# Patient Record
Sex: Female | Born: 1960 | Race: Black or African American | Hispanic: No | Marital: Single
Health system: Southern US, Community
[De-identification: ages and names within clinical notes are randomized; demographics above are authoritative.]

## PROBLEM LIST (undated history)

## (undated) DIAGNOSIS — E785 Hyperlipidemia, unspecified: Secondary | ICD-10-CM

## (undated) DIAGNOSIS — E669 Obesity, unspecified: Secondary | ICD-10-CM

## (undated) DIAGNOSIS — C50919 Malignant neoplasm of unspecified site of unspecified female breast: Secondary | ICD-10-CM

## (undated) DIAGNOSIS — T7840XA Allergy, unspecified, initial encounter: Secondary | ICD-10-CM

## (undated) DIAGNOSIS — L732 Hidradenitis suppurativa: Secondary | ICD-10-CM

## (undated) DIAGNOSIS — D649 Anemia, unspecified: Secondary | ICD-10-CM

## (undated) HISTORY — DX: Obesity, unspecified: E66.9

## (undated) HISTORY — DX: Allergy, unspecified, initial encounter: T78.40XA

---

## 2005-02-06 ENCOUNTER — Ambulatory Visit: Payer: Self-pay | Admitting: General Practice

## 2005-02-19 ENCOUNTER — Ambulatory Visit: Payer: Self-pay | Admitting: General Practice

## 2006-02-20 ENCOUNTER — Ambulatory Visit: Payer: Self-pay | Admitting: General Practice

## 2007-02-26 ENCOUNTER — Ambulatory Visit: Payer: Self-pay | Admitting: General Practice

## 2008-03-02 ENCOUNTER — Ambulatory Visit: Payer: Self-pay | Admitting: General Practice

## 2009-03-08 ENCOUNTER — Ambulatory Visit: Payer: Self-pay | Admitting: General Practice

## 2010-02-28 ENCOUNTER — Ambulatory Visit: Payer: Self-pay | Admitting: General Practice

## 2011-02-27 ENCOUNTER — Ambulatory Visit: Payer: Self-pay

## 2012-02-26 ENCOUNTER — Ambulatory Visit: Payer: Self-pay

## 2012-05-07 DIAGNOSIS — E669 Obesity, unspecified: Secondary | ICD-10-CM

## 2012-05-07 HISTORY — DX: Obesity, unspecified: E66.9

## 2013-03-04 ENCOUNTER — Ambulatory Visit: Payer: Self-pay | Admitting: General Practice

## 2013-06-14 IMAGING — MG MM DIGITAL SCREENING BILAT W/ CAD
1 series · 4 of 4 positions shown · non-contrast
Comparison: none

COMMENTS:

PROCEDURE:     MAM - MAM DGT SCR W/CAD CORP NO ORDER  - February 26, 2012 [DATE]
RESULT:     Breast are moderately dense and nodular. No mass. No pathologic
clustered calcification demonstrated. Tiny axillary lymph nodes are noted.

[R CC · right · 4 of 4 slices shown]
[im 1/4]
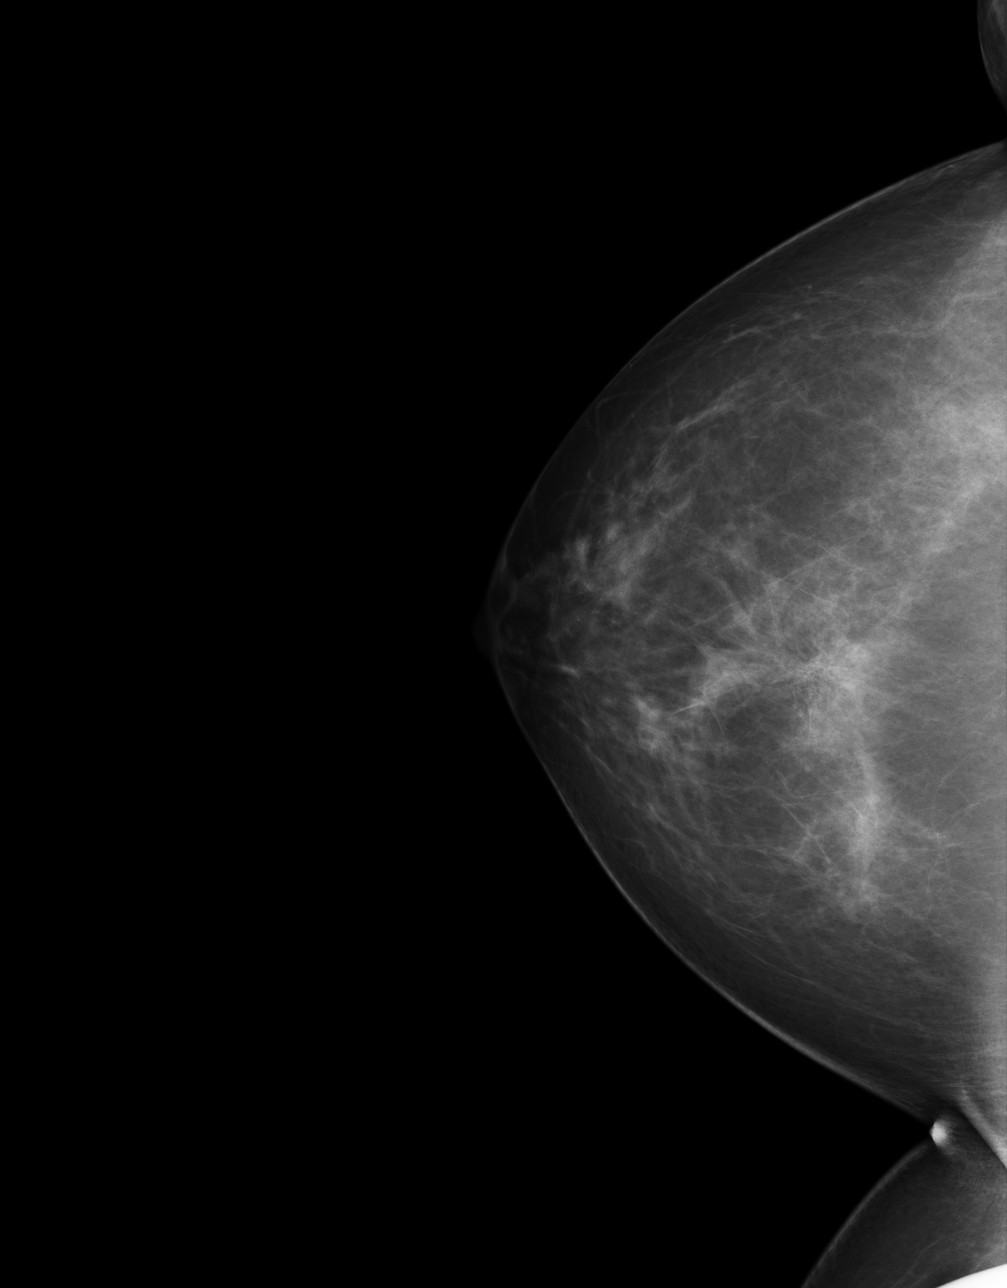
[im 2/4]
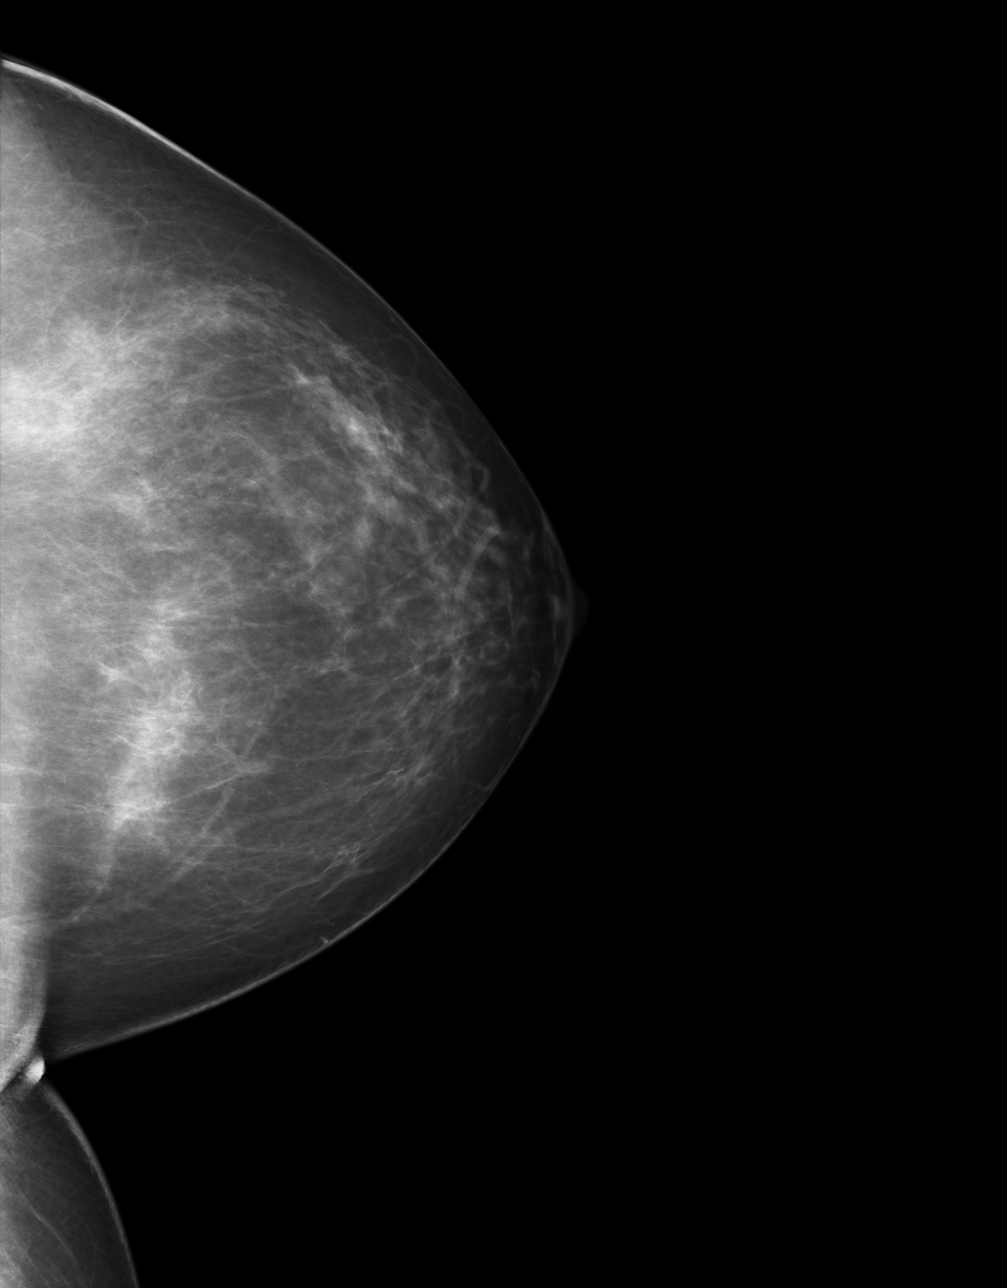
[im 3/4]
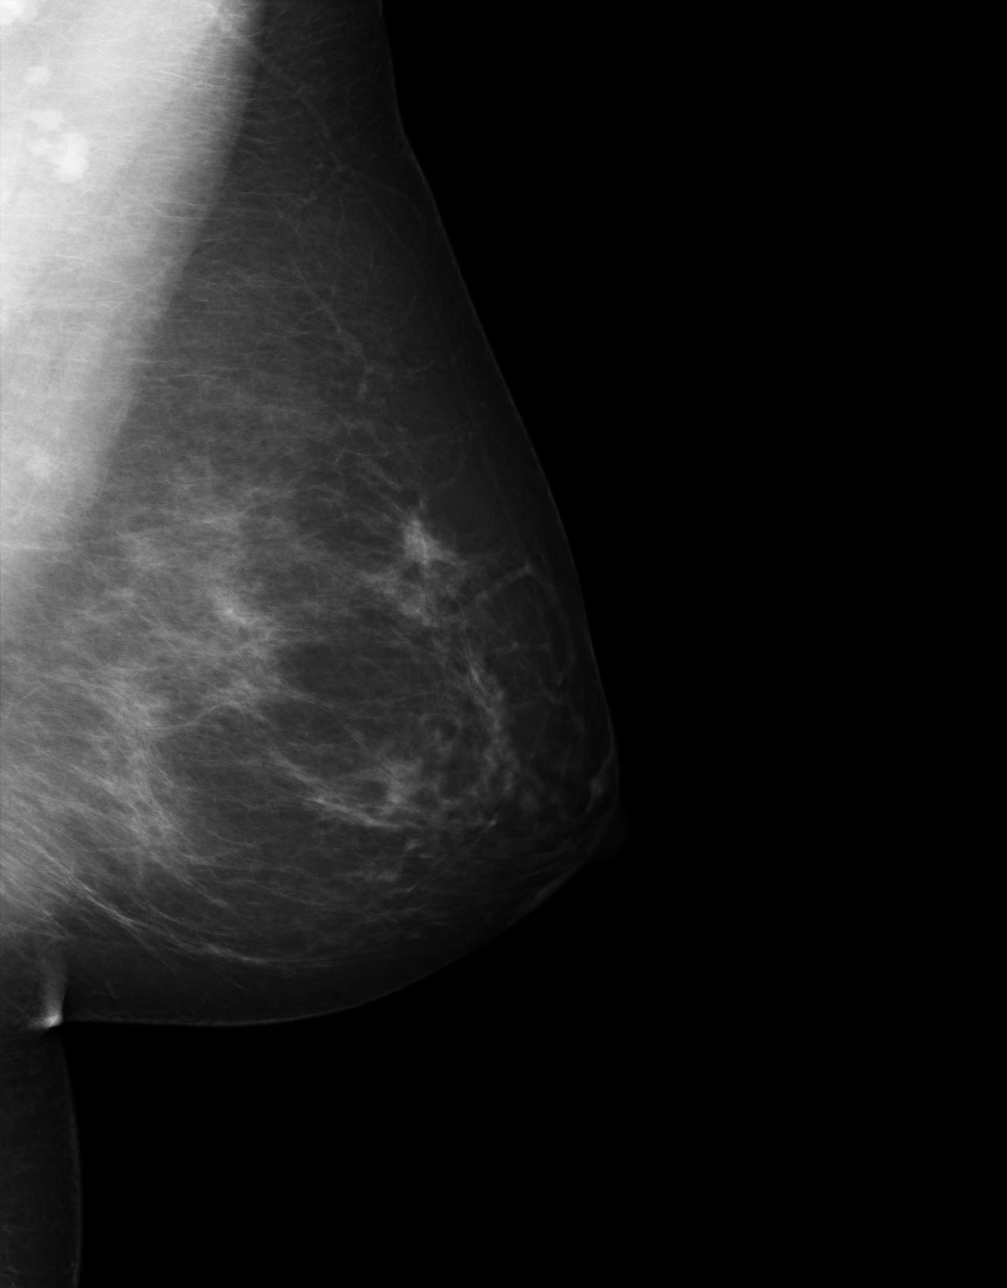
[im 4/4]
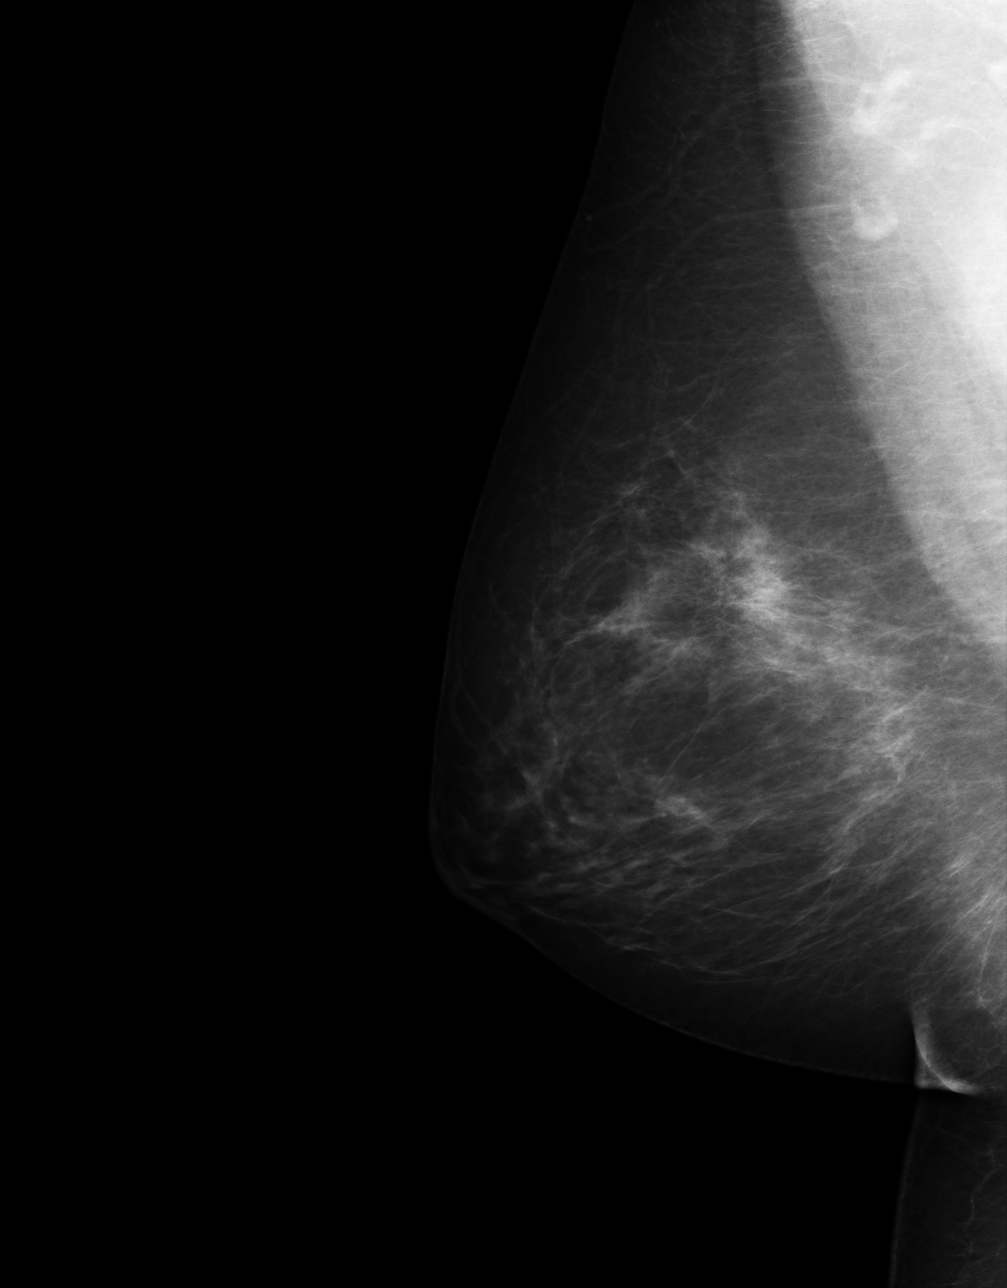

[4 of 4 positions shown; findings below may reference images not displayed]

IMPRESSION: Stable benign exam. No change from prior exams.

BI-RADS: Category 2- Benign Finding

A NEGATIVE MAMMOGRAM REPORT DOES NOT PRECLUDE BIOPSY OR OTHER EVALUATION OF
A CLINICALLY PALPABLE OR OTHERWISE SUSPICIOUS MASS OR LESION. BREAST CANCER
MAY NOT BE DETECTED IN UP TO 10% OF CASES.

## 2014-06-21 IMAGING — MG MM DIGITAL SCREENING BILAT W/ CAD
1 series · 5 of 5 positions shown · non-contrast
Comparison: Previous Exam(s)

CLINICAL DATA: Screening.

EXAM:
DIGITAL SCREENING BILATERAL MAMMOGRAM WITH CAD

[R CC · right · 5 of 5 slices shown]
[im 1/5]
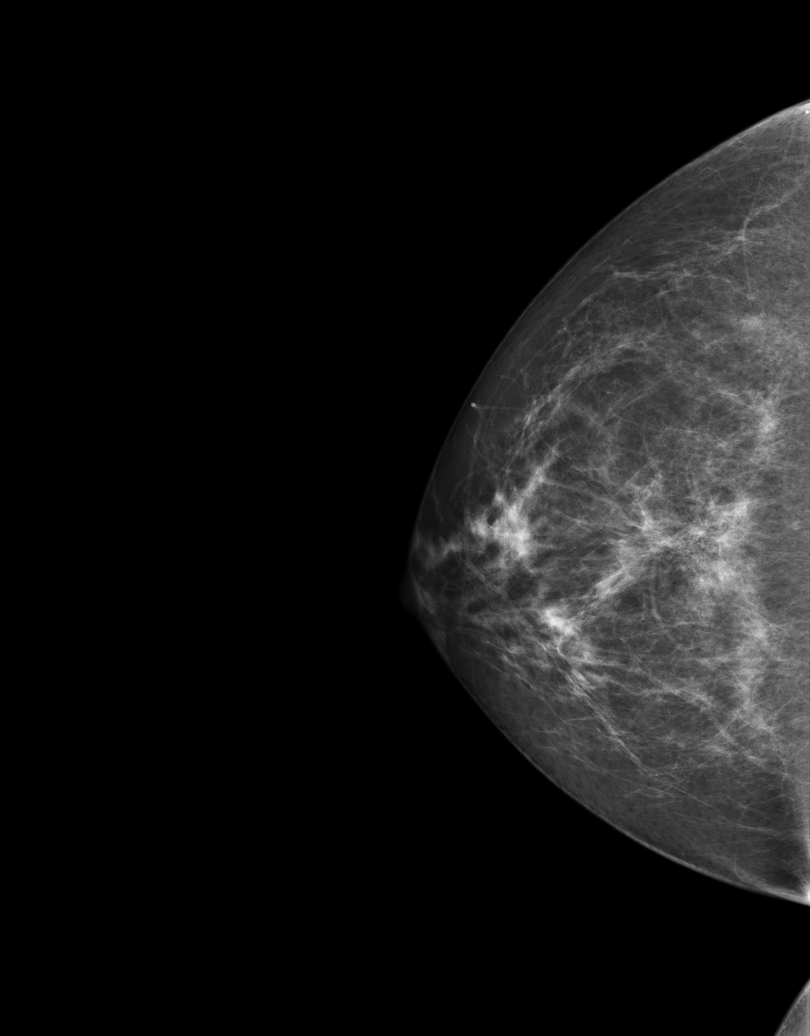
[im 2/5]
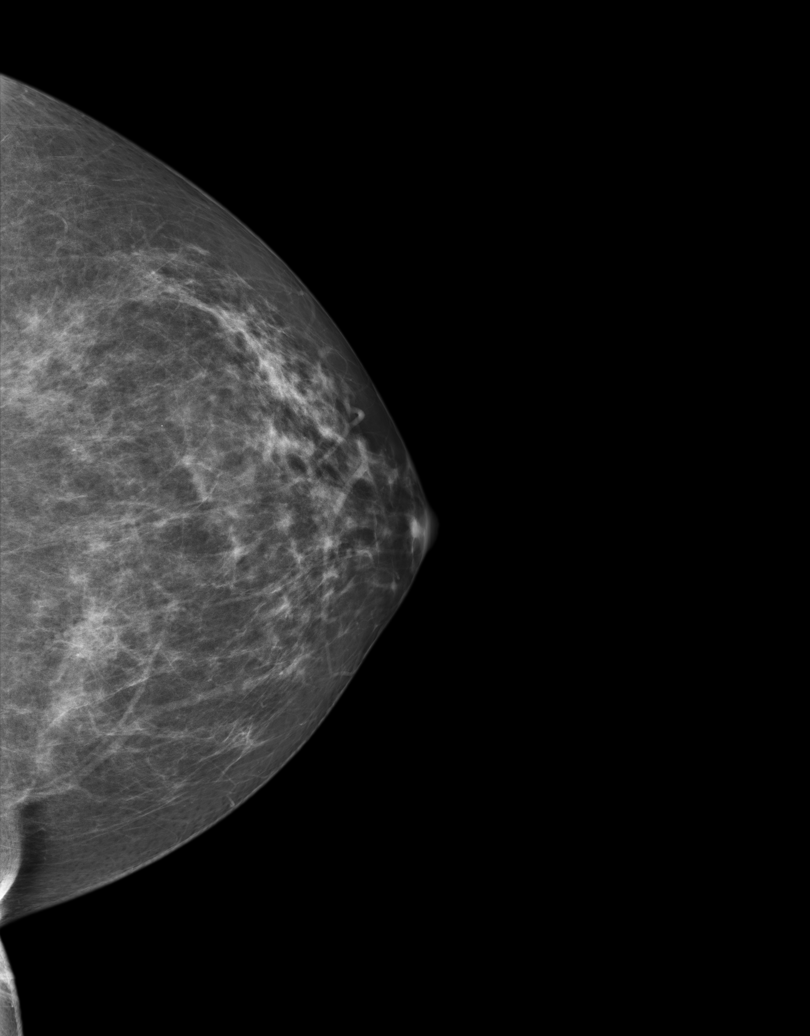
[im 3/5]
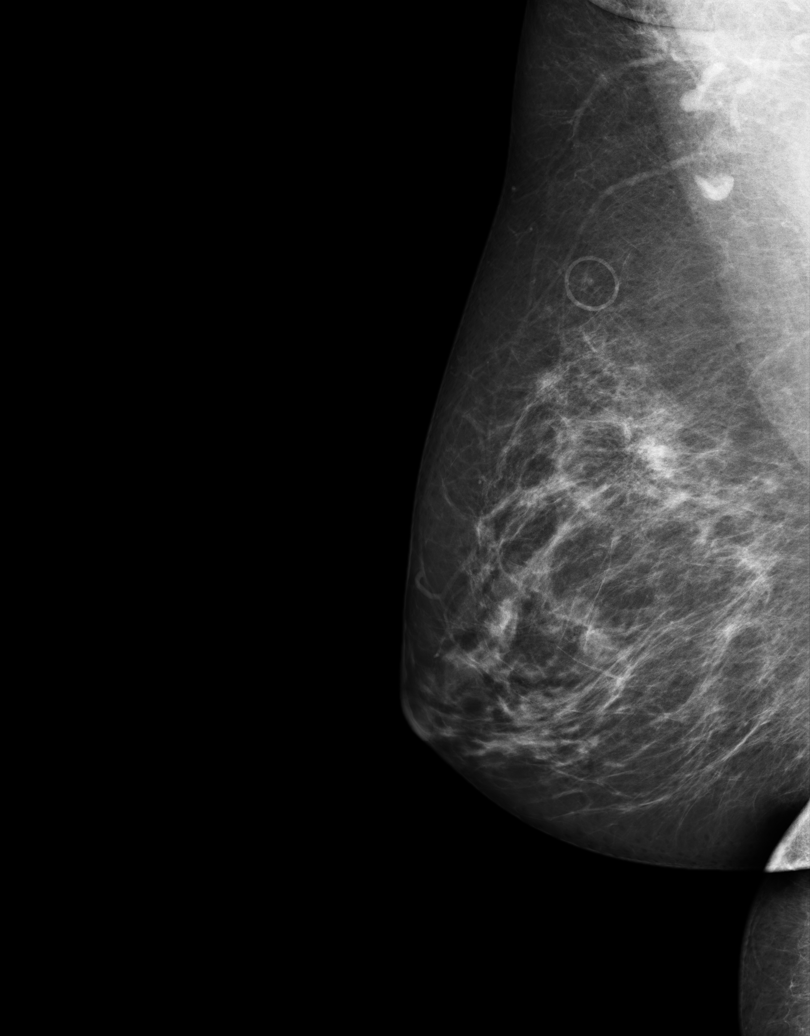
[im 4/5]
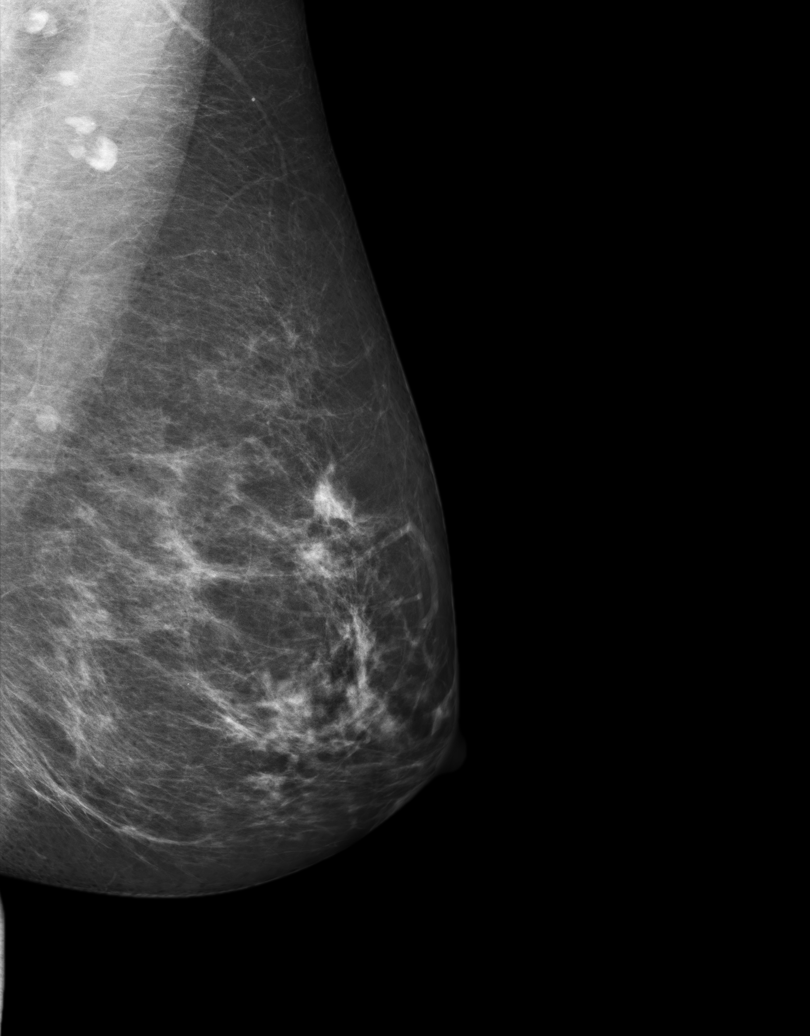
[im 5/5]
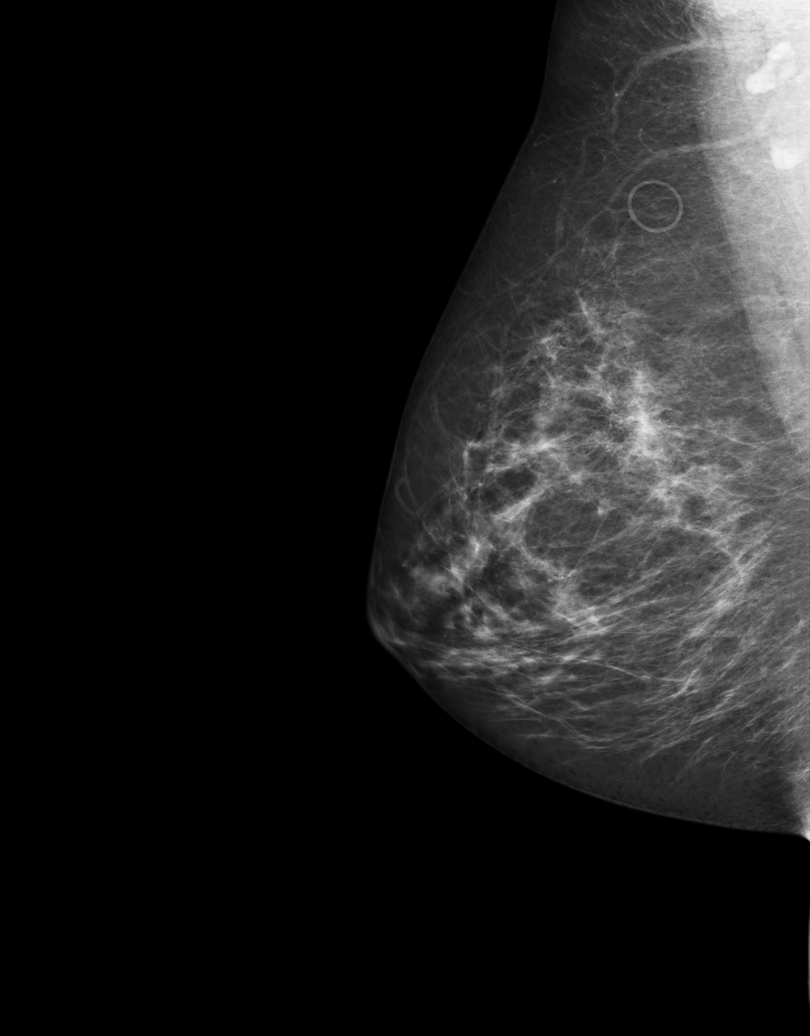

[5 of 5 positions shown; findings below may reference images not displayed]

ACR Breast Density Category c: The breasts are heterogeneously
dense, which may obscure small masses.
FINDINGS: In the right breast, possible distortion warrants further evaluation
with spot compression views and possibly ultrasound. In the left
breast, no suspicious masses or malignant type calcifications are
identified. Images were processed with CAD.
IMPRESSION: Further evaluation is suggested for possible distortion in the right
breast.

RECOMMENDATION:
Diagnostic mammogram and possibly ultrasound of the right breast.
(Code:C6-P-112)

The patient will be contacted regarding the findings, and additional
imaging will be scheduled.

BI-RADS CATEGORY  0: Incomplete. Need additional imaging evaluation
and/or prior mammograms for comparison.

## 2015-02-03 ENCOUNTER — Other Ambulatory Visit: Payer: Self-pay | Admitting: Family Medicine

## 2015-02-03 ENCOUNTER — Other Ambulatory Visit: Payer: Self-pay

## 2015-02-04 LAB — CMP12+LP+TP+TSH+6AC+CBC/D/PLT
A/G RATIO: 1.5 (ref 1.1–2.5)
ALK PHOS: 79 IU/L (ref 39–117)
ALT: 12 IU/L (ref 0–32)
AST: 16 IU/L (ref 0–40)
Albumin: 4.4 g/dL (ref 3.5–5.5)
BASOS: 0 %
BUN / CREAT RATIO: 17 (ref 9–23)
BUN: 13 mg/dL (ref 6–24)
Basophils Absolute: 0 10*3/uL (ref 0.0–0.2)
Bilirubin Total: 0.8 mg/dL (ref 0.0–1.2)
CALCIUM: 9.9 mg/dL (ref 8.7–10.2)
CHOL/HDL RATIO: 2.7 ratio (ref 0.0–4.4)
CREATININE: 0.76 mg/dL (ref 0.57–1.00)
Chloride: 101 mmol/L (ref 97–108)
Cholesterol, Total: 204 mg/dL — ABNORMAL HIGH (ref 100–199)
EOS (ABSOLUTE): 0.2 10*3/uL (ref 0.0–0.4)
EOS: 3 %
Estimated CHD Risk: <0.5 times avg. (ref 0.0–1.0)
Free Thyroxine Index: 2.2 (ref 1.2–4.9)
GFR, EST AFRICAN AMERICAN: 103 mL/min/{1.73_m2} (ref >59)
GFR, EST NON AFRICAN AMERICAN: 89 mL/min/{1.73_m2} (ref >59)
GGT: 11 IU/L (ref 0–60)
GLOBULIN, TOTAL: 2.9 g/dL (ref 1.5–4.5)
GLUCOSE: 93 mg/dL (ref 65–99)
HDL: 75 mg/dL (ref >39)
HEMATOCRIT: 40.9 % (ref 34.0–46.6)
Hemoglobin: 13.3 g/dL (ref 11.1–15.9)
IMMATURE GRANS (ABS): 0 10*3/uL (ref 0.0–0.1)
Immature Granulocytes: 0 %
Iron: 114 ug/dL (ref 27–159)
LDH: 206 IU/L (ref 119–226)
LDL CALC: 119 mg/dL — AB (ref 0–99)
LYMPHS: 51 %
Lymphocytes Absolute: 2.9 10*3/uL (ref 0.7–3.1)
MCH: 30.9 pg (ref 26.6–33.0)
MCHC: 32.5 g/dL (ref 31.5–35.7)
MCV: 95 fL (ref 79–97)
MONOCYTES: 6 %
Monocytes Absolute: 0.4 10*3/uL (ref 0.1–0.9)
NEUTROS ABS: 2.3 10*3/uL (ref 1.4–7.0)
Neutrophils: 40 %
PHOSPHORUS: 3.4 mg/dL (ref 2.5–4.5)
POTASSIUM: 4.6 mmol/L (ref 3.5–5.2)
Platelets: 218 10*3/uL (ref 150–379)
RBC: 4.31 x10E6/uL (ref 3.77–5.28)
RDW: 14.2 % (ref 12.3–15.4)
SODIUM: 141 mmol/L (ref 134–144)
T3 Uptake Ratio: 27 % (ref 24–39)
T4 TOTAL: 8.2 ug/dL (ref 4.5–12.0)
TRIGLYCERIDES: 51 mg/dL (ref 0–149)
TSH: 2.03 u[IU]/mL (ref 0.450–4.500)
Total Protein: 7.3 g/dL (ref 6.0–8.5)
URIC ACID: 6.5 mg/dL (ref 2.5–7.1)
VLDL Cholesterol Cal: 10 mg/dL (ref 5–40)
WBC: 5.8 10*3/uL (ref 3.4–10.8)

## 2015-02-04 LAB — HGB A1C W/O EAG: HEMOGLOBIN A1C: 5.7 % — AB (ref 4.8–5.6)

## 2015-09-26 ENCOUNTER — Other Ambulatory Visit: Payer: Self-pay | Admitting: Family Medicine

## 2015-09-27 LAB — CMP12+LP+TP+TSH+6AC+CBC/D/PLT
ALBUMIN: 4.2 g/dL (ref 3.5–5.5)
ALK PHOS: 101 IU/L (ref 39–117)
ALT: 11 IU/L (ref 0–32)
AST: 12 IU/L (ref 0–40)
Albumin/Globulin Ratio: 1.5 (ref 1.2–2.2)
BASOS: 0 %
BILIRUBIN TOTAL: 0.7 mg/dL (ref 0.0–1.2)
BUN / CREAT RATIO: 18 (ref 9–23)
BUN: 13 mg/dL (ref 6–24)
Basophils Absolute: 0 10*3/uL (ref 0.0–0.2)
CALCIUM: 9.7 mg/dL (ref 8.7–10.2)
CHLORIDE: 100 mmol/L (ref 96–106)
CREATININE: 0.74 mg/dL (ref 0.57–1.00)
Chol/HDL Ratio: 2.6 ratio units (ref 0.0–4.4)
Cholesterol, Total: 206 mg/dL — ABNORMAL HIGH (ref 100–199)
EOS (ABSOLUTE): 0.3 10*3/uL (ref 0.0–0.4)
EOS: 4 %
Free Thyroxine Index: 1.6 (ref 1.2–4.9)
GFR calc Af Amer: 106 mL/min/{1.73_m2} (ref >59)
GFR, EST NON AFRICAN AMERICAN: 92 mL/min/{1.73_m2} (ref >59)
GGT: 12 IU/L (ref 0–60)
GLUCOSE: 91 mg/dL (ref 65–99)
Globulin, Total: 2.8 g/dL (ref 1.5–4.5)
HDL: 80 mg/dL (ref >39)
HEMATOCRIT: 40.5 % (ref 34.0–46.6)
HEMOGLOBIN: 13.2 g/dL (ref 11.1–15.9)
IMMATURE GRANULOCYTES: 0 %
Immature Grans (Abs): 0 10*3/uL (ref 0.0–0.1)
Iron: 58 ug/dL (ref 27–159)
LDH: 192 IU/L (ref 119–226)
LDL CALC: 115 mg/dL — AB (ref 0–99)
LYMPHS ABS: 3.2 10*3/uL — AB (ref 0.7–3.1)
Lymphs: 42 %
MCH: 30.6 pg (ref 26.6–33.0)
MCHC: 32.6 g/dL (ref 31.5–35.7)
MCV: 94 fL (ref 79–97)
MONOS ABS: 0.6 10*3/uL (ref 0.1–0.9)
Monocytes: 8 %
NEUTROS ABS: 3.4 10*3/uL (ref 1.4–7.0)
NEUTROS PCT: 46 %
POTASSIUM: 4.7 mmol/L (ref 3.5–5.2)
Phosphorus: 3.1 mg/dL (ref 2.5–4.5)
Platelets: 239 10*3/uL (ref 150–379)
RBC: 4.31 x10E6/uL (ref 3.77–5.28)
RDW: 14.5 % (ref 12.3–15.4)
SODIUM: 141 mmol/L (ref 134–144)
T3 Uptake Ratio: 26 % (ref 24–39)
T4, Total: 6.3 ug/dL (ref 4.5–12.0)
TOTAL PROTEIN: 7 g/dL (ref 6.0–8.5)
TSH: 3.98 u[IU]/mL (ref 0.450–4.500)
Triglycerides: 53 mg/dL (ref 0–149)
URIC ACID: 6.3 mg/dL (ref 2.5–7.1)
VLDL CHOLESTEROL CAL: 11 mg/dL (ref 5–40)
WBC: 7.5 10*3/uL (ref 3.4–10.8)

## 2015-09-27 LAB — VITAMIN D 25 HYDROXY (VIT D DEFICIENCY, FRACTURES): VIT D 25 HYDROXY: 30.8 ng/mL (ref 30.0–100.0)

## 2015-09-27 LAB — HGB A1C W/O EAG: Hgb A1c MFr Bld: 5.5 % (ref 4.8–5.6)

## 2016-10-02 ENCOUNTER — Ambulatory Visit: Payer: Self-pay | Admitting: *Deleted

## 2016-10-02 VITALS — BP 150/90 | Ht 69.0 in | Wt 272.0 lb

## 2016-10-02 DIAGNOSIS — Z0000 Encounter for general adult medical examination without abnormal findings: Secondary | ICD-10-CM

## 2016-10-02 NOTE — Progress Notes (Signed)
[employer] insurance premium discount evaluation: Labs Drawn. Replacements ROI form signed. Tobacco Free Attestation form signed.  Forms placed in paper chart.  

## 2016-10-03 LAB — CMP12+LP+TP+TSH+6AC+CBC/D/PLT
ALBUMIN: 4.5 g/dL (ref 3.5–5.5)
ALT: 13 IU/L (ref 0–32)
AST: 18 IU/L (ref 0–40)
Albumin/Globulin Ratio: 1.6 (ref 1.2–2.2)
Alkaline Phosphatase: 94 IU/L (ref 39–117)
BUN/Creatinine Ratio: 16 (ref 9–23)
BUN: 13 mg/dL (ref 6–24)
Basophils Absolute: 0 10*3/uL (ref 0.0–0.2)
Basos: 0 %
Bilirubin Total: 0.6 mg/dL (ref 0.0–1.2)
CALCIUM: 9.6 mg/dL (ref 8.7–10.2)
CHOLESTEROL TOTAL: 204 mg/dL — AB (ref 100–199)
Chloride: 101 mmol/L (ref 96–106)
Chol/HDL Ratio: 2.8 ratio (ref 0.0–4.4)
Creatinine, Ser: 0.81 mg/dL (ref 0.57–1.00)
EOS (ABSOLUTE): 0.1 10*3/uL (ref 0.0–0.4)
Eos: 2 %
Estimated CHD Risk: <0.5 times avg. (ref 0.0–1.0)
FREE THYROXINE INDEX: 1.5 (ref 1.2–4.9)
GFR calc Af Amer: 95 mL/min/{1.73_m2} (ref >59)
GFR calc non Af Amer: 82 mL/min/{1.73_m2} (ref >59)
GGT: 13 IU/L (ref 0–60)
Globulin, Total: 2.9 g/dL (ref 1.5–4.5)
Glucose: 91 mg/dL (ref 65–99)
HDL: 73 mg/dL (ref >39)
Hematocrit: 40.9 % (ref 34.0–46.6)
Hemoglobin: 13.4 g/dL (ref 11.1–15.9)
IMMATURE GRANS (ABS): 0 10*3/uL (ref 0.0–0.1)
IMMATURE GRANULOCYTES: 0 %
Iron: 83 ug/dL (ref 27–159)
LDH: 200 IU/L (ref 119–226)
LDL Calculated: 119 mg/dL — ABNORMAL HIGH (ref 0–99)
LYMPHS: 51 %
Lymphocytes Absolute: 3 10*3/uL (ref 0.7–3.1)
MCH: 30.6 pg (ref 26.6–33.0)
MCHC: 32.8 g/dL (ref 31.5–35.7)
MCV: 93 fL (ref 79–97)
Monocytes Absolute: 0.4 10*3/uL (ref 0.1–0.9)
Monocytes: 7 %
NEUTROS PCT: 40 %
Neutrophils Absolute: 2.4 10*3/uL (ref 1.4–7.0)
PHOSPHORUS: 3.2 mg/dL (ref 2.5–4.5)
Platelets: 243 10*3/uL (ref 150–379)
Potassium: 4 mmol/L (ref 3.5–5.2)
RBC: 4.38 x10E6/uL (ref 3.77–5.28)
RDW: 14 % (ref 12.3–15.4)
Sodium: 141 mmol/L (ref 134–144)
T3 Uptake Ratio: 23 % — ABNORMAL LOW (ref 24–39)
T4 TOTAL: 6.4 ug/dL (ref 4.5–12.0)
TSH: 2.77 u[IU]/mL (ref 0.450–4.500)
Total Protein: 7.4 g/dL (ref 6.0–8.5)
Triglycerides: 59 mg/dL (ref 0–149)
Uric Acid: 6.3 mg/dL (ref 2.5–7.1)
VLDL Cholesterol Cal: 12 mg/dL (ref 5–40)
WBC: 5.9 10*3/uL (ref 3.4–10.8)

## 2016-10-03 LAB — VITAMIN D 25 HYDROXY (VIT D DEFICIENCY, FRACTURES): Vit D, 25-Hydroxy: 29.6 ng/mL — ABNORMAL LOW (ref 30.0–100.0)

## 2016-10-03 LAB — HGB A1C W/O EAG: Hgb A1c MFr Bld: 5.3 % (ref 4.8–5.6)

## 2016-10-04 NOTE — Progress Notes (Signed)
Reviewed labs with pt. Discussed sunlight exposure recommendations for vit D level improvement. Discussed recommended diet and exercise. Pt improving diet over past 3 months. Expects to see continued improvement in labs. Planning to discuss HTN and lipid panel with her PCP as she has a strong family Hx of both. Results routed to pcp.

## 2016-10-15 ENCOUNTER — Telehealth: Payer: Self-pay | Admitting: *Deleted

## 2016-10-15 DIAGNOSIS — L2089 Other atopic dermatitis: Secondary | ICD-10-CM

## 2016-10-15 NOTE — Telephone Encounter (Signed)
Pt reports previous providers in clinic would refill her eczema cream. Requesting refill now. Previous fills documented in paper chart in 2014 and 2016.  Fluocinonide 0.05% BID 60g Pharmacy [facility] [place].

## 2016-10-16 ENCOUNTER — Encounter: Payer: Self-pay | Admitting: *Deleted

## 2016-10-16 DIAGNOSIS — L209 Atopic dermatitis, unspecified: Secondary | ICD-10-CM | POA: Insufficient documentation

## 2016-10-16 MED ORDER — FLUOCINONIDE-E 0.05 % EX CREA: 1.0000 "application " | TOPICAL_CREAM | Freq: Two times a day (BID) | CUTANEOUS | 0 refills | Status: DC

## 2016-10-16 NOTE — Telephone Encounter (Signed)
Discussed with patient 1 tube fluocinonide 0.05% 60grams lasts approximately 1 year; applies BID prn arms and stomach.  History of nickel allergy and eczema prn use.  Routine follow up with PCM; refill submitted for patient if worsening seek re-evaluation.  Patient reported spring flare common for her/worsening of symptoms.  Patient verbalized understanding information/instructions, agreed with plan of care and had no further questions at this time.

## 2016-10-16 NOTE — Telephone Encounter (Signed)
Patient requested refill lidex topical.

## 2018-04-17 ENCOUNTER — Encounter: Payer: Self-pay | Admitting: Registered Nurse

## 2018-04-17 ENCOUNTER — Ambulatory Visit: Payer: Self-pay | Admitting: Registered Nurse

## 2018-04-17 VITALS — BP 170/92 | HR 108 | Temp 98.3°F

## 2018-04-17 DIAGNOSIS — J0100 Acute maxillary sinusitis, unspecified: Secondary | ICD-10-CM

## 2018-04-17 DIAGNOSIS — A084 Viral intestinal infection, unspecified: Secondary | ICD-10-CM

## 2018-04-17 DIAGNOSIS — R030 Elevated blood-pressure reading, without diagnosis of hypertension: Secondary | ICD-10-CM

## 2018-04-17 DIAGNOSIS — H6983 Other specified disorders of Eustachian tube, bilateral: Secondary | ICD-10-CM

## 2018-04-17 MED ORDER — ACETAMINOPHEN 500 MG PO TABS: 1000.0000 mg | ORAL_TABLET | Freq: Four times a day (QID) | ORAL | 0 refills | Status: AC | PRN

## 2018-04-17 MED ORDER — AZITHROMYCIN 250 MG PO TABS: ORAL_TABLET | ORAL | 0 refills | Status: DC

## 2018-04-17 MED ORDER — FLUTICASONE PROPIONATE 50 MCG/ACT NA SUSP: 1.0000 | Freq: Two times a day (BID) | NASAL | 1 refills | Status: DC

## 2018-04-17 NOTE — Progress Notes (Signed)
Subjective:    Patient ID: Kathleen Silva, female    DOB: 1961-01-24, 57 y.o.   MRN: [id number]  57y/o African-American female pt c/o frontal and maxillary sinus pain and pressure x4 days. Denies pressure behind the eyes or dental pain. Using OTC sinus med at home. L ear itching and eustachian tube discomfort. Denies ear pain, drainage, hearing changes. Denies sore throat, cough, chest congestion. +nasal congestion, frequent throat clearing.        Review of Systems  Constitutional: Positive for appetite change. Negative for activity change, chills, diaphoresis, fatigue, fever and unexpected weight change.  HENT: Positive for congestion, ear pain, sinus pressure and sinus pain. Negative for dental problem, drooling, ear discharge, facial swelling, hearing loss, mouth sores, nosebleeds, postnasal drip, rhinorrhea, sneezing, sore throat, tinnitus, trouble swallowing and voice change.   Eyes: Negative for photophobia, pain, discharge, redness, itching and visual disturbance.  Respiratory: Negative for cough, choking, chest tightness, shortness of breath, wheezing and stridor.   Cardiovascular: Negative for chest pain, palpitations and leg swelling.  Gastrointestinal: Positive for diarrhea. Negative for abdominal distention, abdominal pain, constipation, nausea and vomiting.  Endocrine: Negative for cold intolerance and heat intolerance.  Genitourinary: Negative for difficulty urinating.  Musculoskeletal: Negative for arthralgias, back pain, gait problem, joint swelling, myalgias, neck pain and neck stiffness.  Skin: Negative for color change, pallor, rash and wound.  Allergic/Immunologic: Negative for environmental allergies and food allergies.  Neurological: Positive for headaches. Negative for dizziness, tremors, seizures, syncope, facial asymmetry, speech difficulty, weakness, light-headedness and numbness.  Hematological: Negative for adenopathy. Does not bruise/bleed easily.   Psychiatric/Behavioral: Negative for agitation, behavioral problems, confusion and sleep disturbance.       Objective:   Physical Exam Vitals signs and nursing note reviewed.  Constitutional:      General: She is not in acute distress.    Appearance: Normal appearance. She is well-developed. She is obese. She is not ill-appearing, toxic-appearing or diaphoretic.  HENT:     Head: Normocephalic and atraumatic.     Jaw: There is normal jaw occlusion. No trismus.     Right Ear: Hearing, ear canal and external ear normal. A middle ear effusion is present. There is no impacted cerumen.     Left Ear: Hearing, ear canal and external ear normal. A middle ear effusion is present. There is no impacted cerumen.     Nose: Mucosal edema, congestion and rhinorrhea present. No nasal deformity, septal deviation or laceration.     Right Sinus: Maxillary sinus tenderness present. No frontal sinus tenderness.     Left Sinus: Maxillary sinus tenderness present. No frontal sinus tenderness.     Mouth/Throat:     Lips: Pink.     Mouth: Mucous membranes are moist. Mucous membranes are not pale, not dry and not cyanotic. No lacerations or oral lesions.     Dentition: Normal dentition. Does not have dentures. No dental caries, dental abscesses or gum lesions.     Pharynx: Uvula midline. Posterior oropharyngeal erythema present. No oropharyngeal exudate or uvula swelling.     Tonsils: No tonsillar exudate or tonsillar abscesses. Swelling: 0 on the right. 0 on the left.     Comments: Cobblestoning posterior pharynx; bilateral TMs air fluid level clear; bilateral allergic shiners; mild maxillary tenderness with palpation and pain behind bridge of nose and eyes with flexion at waist; clear discharge bilateral nasal turbinates edema/erythema Eyes:     General: Lids are normal. No scleral icterus.       Right  eye: No foreign body, discharge or hordeolum.        Left eye: No foreign body, discharge or hordeolum.      Extraocular Movements:     Right eye: Normal extraocular motion and no nystagmus.     Left eye: Normal extraocular motion and no nystagmus.     Conjunctiva/sclera: Conjunctivae normal.     Right eye: Right conjunctiva is not injected. No chemosis, exudate or hemorrhage.    Left eye: Left conjunctiva is not injected. No chemosis, exudate or hemorrhage.    Pupils: Pupils are equal, round, and reactive to light. Pupils are equal.     Right eye: Pupil is round and reactive.     Left eye: Pupil is round and reactive.  Neck:     Musculoskeletal: Normal range of motion and neck supple. Normal range of motion. No edema, erythema, neck rigidity, spinous process tenderness or muscular tenderness.     Thyroid: No thyroid mass or thyromegaly.     Trachea: Trachea and phonation normal. No tracheal tenderness or tracheal deviation.  Cardiovascular:     Rate and Rhythm: Regular rhythm. Tachycardia present.     Chest Wall: PMI is not displaced.     Pulses:          Radial pulses are 2+ on the right side and 2+ on the left side.     Heart sounds: Normal heart sounds, S1 normal and S2 normal. No murmur. No friction rub. No gallop.   Pulmonary:     Effort: Pulmonary effort is normal. No accessory muscle usage or respiratory distress.     Breath sounds: Normal breath sounds and air entry. No stridor or decreased air movement. No decreased breath sounds, wheezing, rhonchi or rales.     Comments: No cough observed in exam room ;spoke full sentences without difficulty Chest:     Chest wall: No tenderness.  Abdominal:     General: There is no distension.     Palpations: Abdomen is soft.     Tenderness: There is no guarding.  Musculoskeletal: Normal range of motion.        General: No tenderness.     Right shoulder: Normal.     Left shoulder: Normal.     Right elbow: Normal.    Left elbow: Normal.     Right hip: Normal.     Left hip: Normal.     Right knee: Normal.     Left knee: Normal.     Cervical  back: Normal.     Thoracic back: Normal.     Lumbar back: Normal.     Right hand: Normal.     Left hand: Normal.  Lymphadenopathy:     Head:     Right side of head: No submental, submandibular, tonsillar, preauricular, posterior auricular or occipital adenopathy.     Left side of head: No submental, submandibular, tonsillar, preauricular, posterior auricular or occipital adenopathy.     Cervical: No cervical adenopathy.     Right cervical: No superficial, deep or posterior cervical adenopathy.    Left cervical: No superficial, deep or posterior cervical adenopathy.  Skin:    General: Skin is warm and dry.     Capillary Refill: Capillary refill takes less than 2 seconds.     Coloration: Skin is not pale.     Findings: No abrasion, bruising, burn, ecchymosis, erythema, laceration, lesion, petechiae or rash.     Nails: There is no clubbing.   Neurological:  General: No focal deficit present.     Mental Status: She is alert and oriented to person, place, and time. She is not disoriented.     GCS: GCS eye subscore is 4. GCS verbal subscore is 5. GCS motor subscore is 6.     Cranial Nerves: No cranial nerve deficit.     Sensory: Sensation is intact. No sensory deficit.     Motor: Motor function is intact. No weakness, tremor, atrophy, abnormal muscle tone or seizure activity.     Coordination: Coordination is intact. Coordination normal.     Gait: Gait normal.     Comments: On/off exam table and in/out of chair without difficulty; gait sure and steady in hallway  Psychiatric:        Attention and Perception: Attention and perception normal.        Mood and Affect: Mood and affect normal.        Speech: Speech normal.        Behavior: Behavior normal. Behavior is cooperative.        Thought Content: Thought content normal.        Cognition and Memory: Cognition and memory normal.        Judgment: Judgment normal.           Assessment & Plan:  A-acute maxillary sinusitis and  viral gastroenteritis,elevated blood pressure, morbid obesity, eustachian tube dysfunction  P-on OTC medications and recent diarrhea push po fluids.  Discussed side effects of otc cough and cold medications increased heartrate/blood pressure possible.  Acute illness/pain can also increase pulse and BP.  Patient to follow up with RN Rolly SalterHaley when acute illness resolved and off OTC meds.  Discussed ER if chest pain, worst headache of life, dyspnea or visual changes for re-evaluation.  Will see RN Rolly SalterHaley for repeat BP check once acute illness resolved or prn worsening of symptoms sooner.  Exitcare handout hypertension.  Patient verbalized understanding information/instructions, agreed with plan of care and had no further questions at this time.  Restart flonase 1 spray each nostril BID, saline 2 sprays each nostril q2h wa prn congestion #1 RF1 electronic Rx to her pharmacy of choice.  Start nasal saline 2 sprays each nostril q2h wa given 1 bottle from clinic stock.  May continue otc cough and cold formula per manufacturer instructions.  Tylenol 1000mg  po QID prn pain discussed motrin could further raise blood pressure avoid at this time.  If no improvement with 48 hours of saline and flonase use start azithromycin 250mg  sig take 2 tabs day1 then 1 tab daily days 2-5 #6 RF0 dispensed from [facility] to patient  Denied personal or family history of ENT cancer.  Shower BID especially prior to bed. No evidence of systemic bacterial infection, non toxic and well hydrated.  I do not see where any further testing or imaging is necessary at this time.   I will suggest supportive care, rest, good hygiene and encourage the patient to take adequate fluids.  The patient is to return to clinic or EMERGENCY ROOM if symptoms worsen or change significantly.  Exitcare handout on sinusitis and sinus rinse.  Patient verbalized agreement and understanding of treatment plan and had no further questions at this time.   P2:  Hand washing and  cover cough  Hydrate to keep urine pale clear yellow tinged.  If diarrhea returns decrease juice/added sugars, fried/spicy foods and eat smaller portions dairy/meat if stomach upset.  Recommend soup broth, gatorade/powerade, water, ginger ale.  I have recommended  clear fluids and bland diet. Avoid dairy/spicy, fried and large portions of meat while having diarrhea Return to the clinic if symptoms persist or worsen; I have alerted the patient to call if high fever, dehydration, marked weakness, fainting, increased abdominal pain, blood in stool or vomit (red or black). Exitcare handout on gastroenteritis and diarrhea Patient verbalized agreement and understanding of treatment plan and had no further questions at this time.   Supportive treatment.   No evidence of invasive bacterial infection, non toxic and well hydrated.  This is most likely self limiting viral infection.  I do not see where any further testing or imaging is necessary at this time.   I will suggest supportive care, rest, good hygiene and encourage the patient to take adequate fluids.  The patient is to return to clinic or EMERGENCY ROOM if symptoms worsen or change significantly e.g. ear pain, fever, purulent discharge from ears or bleeding.  Exitcare handout on eustachian tube dysfunction.  Patient verbalized agreement and understanding of treatment plan.

## 2018-04-17 NOTE — Patient Instructions (Signed)
Eustachian Tube Dysfunction The eustachian tube connects the middle ear to the back of the nose. It regulates air pressure in the middle ear by allowing air to move between the ear and nose. It also helps to drain fluid from the middle ear space. When the eustachian tube does not function properly, air pressure, fluid, or both can build up in the middle ear. Eustachian tube dysfunction can affect one or both ears. What are the causes? This condition happens when the eustachian tube becomes blocked or cannot open normally. This may result from:  Ear infections.  Colds and other upper respiratory infections.  Allergies.  Irritation, such as from cigarette smoke or acid from the stomach coming up into the esophagus (gastroesophageal reflux).  Sudden changes in air pressure, such as from descending in an airplane.  Abnormal growths in the nose or throat, such as nasal polyps, tumors, or enlarged tissue at the back of the throat (adenoids).  What increases the risk? This condition may be more likely to develop in people who smoke and people who are overweight. Eustachian tube dysfunction may also be more likely to develop in children, especially children who have:  Certain birth defects of the mouth, such as cleft palate.  Large tonsils and adenoids.  What are the signs or symptoms? Symptoms of this condition may include:  A feeling of fullness in the ear.  Ear pain.  Clicking or popping noises in the ear.  Ringing in the ear.  Hearing loss.  Loss of balance.  Symptoms may get worse when the air pressure around you changes, such as when you travel to an area of high elevation or fly on an airplane. How is this diagnosed? This condition may be diagnosed based on:  Your symptoms.  A physical exam of your ear, nose, and throat.  Tests, such as those that measure: ? The movement of your eardrum (tympanogram). ? Your hearing (audiometry).  How is this treated? Treatment  depends on the cause and severity of your condition. If your symptoms are mild, you may be able to relieve your symptoms by moving air into ("popping") your ears. If you have symptoms of fluid in your ears, treatment may include:  Decongestants.  Antihistamines.  Nasal sprays or ear drops that contain medicines that reduce swelling (steroids).  In some cases, you may need to have a procedure to drain the fluid in your eardrum (myringotomy). In this procedure, a small tube is placed in the eardrum to:  Drain the fluid.  Restore the air in the middle ear space.  Follow these instructions at home:  Take over-the-counter and prescription medicines only as told by your health care provider.  Use techniques to help pop your ears as recommended by your health care provider. These may include: ? Chewing gum. ? Yawning. ? Frequent, forceful swallowing. ? Closing your mouth, holding your nose closed, and gently blowing as if you are trying to blow air out of your nose.  Do not do any of the following until your health care provider approves: ? Travel to high altitudes. ? Fly in airplanes. ? Work in a pressurized cabin or room. ? Scuba dive.  Keep your ears dry. Dry your ears completely after showering or bathing.  Do not smoke.  Keep all follow-up visits as told by your health care provider. This is important. Contact a health care provider if:  Your symptoms do not go away after treatment.  Your symptoms come back after treatment.  You are   unable to pop your ears.  You have: ? A fever. ? Pain in your ear. ? Pain in your head or neck. ? Fluid draining from your ear.  Your hearing suddenly changes.  You become very dizzy.  You lose your balance. This information is not intended to replace advice given to you by your health care provider. Make sure you discuss any questions you have with your health care provider. Document Released: 05/20/2015 Document Revised: 09/29/2015  Document Reviewed: 05/12/2014 Elsevier Interactive Patient Education  2018 [employer]. Sinus Rinse What is a sinus rinse? A sinus rinse is a simple home treatment that is used to rinse your sinuses with a sterile mixture of salt and water (saline solution). Sinuses are air-filled spaces in your skull behind the bones of your face and forehead that open into your nasal cavity. You will use the following:  Saline solution.  Neti pot or spray bottle. This releases the saline solution into your nose and through your sinuses. Neti pots and spray bottles can be purchased at [employer], a health food store, or online.  When would I do a sinus rinse? A sinus rinse can help to clear mucus, dirt, dust, or pollen from the nasal cavity. You may do a sinus rinse when you have a cold, a virus, nasal allergy symptoms, a sinus infection, or stuffiness in the nose or sinuses. If you are considering a sinus rinse:  Ask your child's health care provider before performing a sinus rinse on your child.  Do not do a sinus rinse if you have had ear or nasal surgery, ear infection, or blocked ears.  How do I do a sinus rinse?  Wash your hands.  Disinfect your device according to the directions provided and then dry it.  Use the solution that comes with your device or one that is sold separately in stores. Follow the mixing directions on the package.  Fill your device with the amount of saline solution as directed by the device instructions.  Stand over a sink and tilt your head sideways over the sink.  Place the spout of the device in your upper nostril (the one closer to the ceiling).  Gently pour or squeeze the saline solution into the nasal cavity. The liquid should drain to the lower nostril if you are not overly congested.  Gently blow your nose. Blowing too hard may cause ear pain.  Repeat in the other nostril.  Clean and rinse your device with clean water and then air-dry it. Are  there risks of a sinus rinse? Sinus rinse is generally very safe and effective. However, there are a few risks, which include:  A burning sensation in the sinuses. This may happen if you do not make the saline solution as directed. Make sure to follow all directions when making the saline solution.  Infection from contaminated water. This is rare, but possible.  Nasal irritation.  This information is not intended to replace advice given to you by your health care provider. Make sure you discuss any questions you have with your health care provider. Document Released: 11/18/2013 Document Revised: 03/20/2016 Document Reviewed: 09/08/2013 Elsevier Interactive Patient Education  2017 [employer] Sinusitis, Adult Sinusitis is soreness and inflammation of your sinuses. Sinuses are hollow spaces in the bones around your face. Your sinuses are located:  Around your eyes.  In the middle of your forehead.  Behind your nose.  In your cheekbones.  Your sinuses and nasal passages are lined with a  stringy fluid (mucus). Mucus normally drains out of your sinuses. When your nasal tissues become inflamed or swollen, the mucus can become trapped or blocked so air cannot flow through your sinuses. This allows bacteria, viruses, and funguses to grow, which leads to infection. Sinusitis can develop quickly and last for 7?10 days (acute) or for more than 12 weeks (chronic). Sinusitis often develops after a cold. What are the causes? This condition is caused by anything that creates swelling in the sinuses or stops mucus from draining, including:  Allergies.  Asthma.  Bacterial or viral infection.  Abnormally shaped bones between the nasal passages.  Nasal growths that contain mucus (nasal polyps).  Narrow sinus openings.  Pollutants, such as chemicals or irritants in the air.  A foreign object stuck in the nose.  A fungal infection. This is rare.  What increases the risk? The following  factors may make you more likely to develop this condition:  Having allergies or asthma.  Having had a recent cold or respiratory tract infection.  Having structural deformities or blockages in your nose or sinuses.  Having a weak immune system.  Doing a lot of swimming or diving.  Overusing nasal sprays.  Smoking.  What are the signs or symptoms? The main symptoms of this condition are pain and a feeling of pressure around the affected sinuses. Other symptoms include:  Upper toothache.  Earache.  Headache.  Bad breath.  Decreased sense of smell and taste.  A cough that may get worse at night.  Fatigue.  Fever.  Thick drainage from your nose. The drainage is often green and it may contain pus (purulent).  Stuffy nose or congestion.  Postnasal drip. This is when extra mucus collects in the throat or back of the nose.  Swelling and warmth over the affected sinuses.  Sore throat.  Sensitivity to light.  How is this diagnosed? This condition is diagnosed based on symptoms, a medical history, and a physical exam. To find out if your condition is acute or chronic, your health care provider may:  Look in your nose for signs of nasal polyps.  Tap over the affected sinus to check for signs of infection.  View the inside of your sinuses using an imaging device that has a light attached (endoscope).  If your health care provider suspects that you have chronic sinusitis, you may also:  Be tested for allergies.  Have a sample of mucus taken from your nose (nasal culture) and checked for bacteria.  Have a mucus sample examined to see if your sinusitis is related to an allergy.  If your sinusitis does not respond to treatment and it lasts longer than 8 weeks, you may have an MRI or CT scan to check your sinuses. These scans also help to determine how severe your infection is. In rare cases, a bone biopsy may be done to rule out more serious types of fungal sinus  disease. How is this treated? Treatment for sinusitis depends on the cause and whether your condition is chronic or acute. If a virus is causing your sinusitis, your symptoms will go away on their own within 10 days. You may be given medicines to relieve your symptoms, including:  Topical nasal decongestants. They shrink swollen nasal passages and let mucus drain from your sinuses.  Antihistamines. These drugs block inflammation that is triggered by allergies. This can help to ease swelling in your nose and sinuses.  Topical nasal corticosteroids. These are nasal sprays that ease inflammation and swelling  in your nose and sinuses.  Nasal saline washes. These rinses can help to get rid of thick mucus in your nose.  If your condition is caused by bacteria, you will be given an antibiotic medicine. If your condition is caused by a fungus, you will be given an antifungal medicine. Surgery may be needed to correct underlying conditions, such as narrow nasal passages. Surgery may also be needed to remove polyps. Follow these instructions at home: Medicines  Take, use, or apply over-the-counter and prescription medicines only as told by your health care provider. These may include nasal sprays.  If you were prescribed an antibiotic medicine, take it as told by your health care provider. Do not stop taking the antibiotic even if you start to feel better. Hydrate and Humidify  Drink enough water to keep your urine clear or pale yellow. Staying hydrated will help to thin your mucus.  Use a cool mist humidifier to keep the humidity level in your home above 50%.  Inhale steam for 10-15 minutes, 3-4 times a day or as told by your health care provider. You can do this in the bathroom while a hot shower is running.  Limit your exposure to cool or dry air. Rest  Rest as much as possible.  Sleep with your head raised (elevated).  Make sure to get enough sleep each night. General  instructions  Apply a warm, moist washcloth to your face 3-4 times a day or as told by your health care provider. This will help with discomfort.  Wash your hands often with soap and water to reduce your exposure to viruses and other germs. If soap and water are not available, use hand sanitizer.  Do not smoke. Avoid being around people who are smoking (secondhand smoke).  Keep all follow-up visits as told by your health care provider. This is important. Contact a health care provider if:  You have a fever.  Your symptoms get worse.  Your symptoms do not improve within 10 days. Get help right away if:  You have a severe headache.  You have persistent vomiting.  You have pain or swelling around your face or eyes.  You have vision problems.  You develop confusion.  Your neck is stiff.  You have trouble breathing. This information is not intended to replace advice given to you by your health care provider. Make sure you discuss any questions you have with your health care provider. Document Released: 04/23/2005 Document Revised: 12/18/2015 Document Reviewed: 02/16/2015 Elsevier Interactive Patient Education  2018 [employer]. Obesity, Adult Obesity is the condition of having too much total body fat. Being overweight or obese means that your weight is greater than what is considered healthy for your body size. Obesity is determined by a measurement called BMI. BMI is an estimate of body fat and is calculated from height and weight. For adults, a BMI of 30 or higher is considered obese. Obesity can eventually lead to other health concerns and major illnesses, including:  Stroke.  Coronary artery disease (CAD).  Type 2 diabetes.  Some types of cancer, including cancers of the colon, breast, uterus, and gallbladder.  Osteoarthritis.  High blood pressure (hypertension).  High cholesterol.  Sleep apnea.  Gallbladder stones.  Infertility problems.  What are the  causes? The main cause of obesity is taking in (consuming) more calories than your body uses for energy. Other factors that contribute to this condition may include:  Being born with genes that make you more likely to become  obese.  Having a medical condition that causes obesity. These conditions include: ? Hypothyroidism. ? Polycystic ovarian syndrome (PCOS). ? Binge-eating disorder. ? Cushing syndrome.  Taking certain medicines, such as steroids, antidepressants, and seizure medicines.  Not being physically active (sedentary lifestyle).  Living where there are limited places to exercise safely or buy healthy foods.  Not getting enough sleep.  What increases the risk? The following factors may increase your risk of this condition:  Having a family history of obesity.  Being a woman of African-American descent.  Being a man of Hispanic descent.  What are the signs or symptoms? Having excessive body fat is the main symptom of this condition. How is this diagnosed? This condition may be diagnosed based on:  Your symptoms.  Your medical history.  A physical exam. Your health care provider may measure: ? Your BMI. If you are an adult with a BMI between 25 and less than 30, you are considered overweight. If you are an adult with a BMI of 30 or higher, you are considered obese. ? The distances around your hips and your waist (circumferences). These may be compared to each other to help diagnose your condition. ? Your skinfold thickness. Your health care provider may gently pinch a fold of your skin and measure it.  How is this treated? Treatment for this condition often includes changing your lifestyle. Treatment may include some or all of the following:  Dietary changes. Work with your health care provider and a dietitian to set a weight-loss goal that is healthy and reasonable for you. Dietary changes may include eating: ? Smaller portions. A portion size is the amount of a  particular food that is healthy for you to eat at one time. This varies from person to person. ? Low-calorie or low-fat options. ? More whole grains, fruits, and vegetables.  Regular physical activity. This may include aerobic activity (cardio) and strength training.  Medicine to help you lose weight. Your health care provider may prescribe medicine if you are unable to lose 1 pound a week after 6 weeks of eating more healthily and doing more physical activity.  Surgery. Surgical options may include gastric banding and gastric bypass. Surgery may be done if: ? Other treatments have not helped to improve your condition. ? You have a BMI of 40 or higher. ? You have life-threatening health problems related to obesity.  Follow these instructions at home:  Eating and drinking   Follow recommendations from your health care provider about what you eat and drink. Your health care provider may advise you to: ? Limit fast foods, sweets, and processed snack foods. ? Choose low-fat options, such as low-fat milk instead of whole milk. ? Eat 5 or more servings of fruits or vegetables every day. ? Eat at home more often. This gives you more control over what you eat. ? Choose healthy foods when you eat out. ? Learn what a healthy portion size is. ? Keep low-fat snacks on hand. ? Avoid sugary drinks, such as soda, fruit juice, iced tea sweetened with sugar, and flavored milk. ? Eat a healthy breakfast.  Drink enough water to keep your urine clear or pale yellow.  Do not go without eating for long periods of time (do not fast) or follow a fad diet. Fasting and fad diets can be unhealthy and even dangerous. Physical Activity  Exercise regularly, as told by your health care provider. Ask your health care provider what types of exercise are safe for you  and how often you should exercise.  Warm up and stretch before being active.  Cool down and stretch after being active.  Rest between periods of  activity. Lifestyle  Limit the time that you spend in front of your TV, computer, or video game system.  Find ways to reward yourself that do not involve food.  Limit alcohol intake to no more than 1 drink a day for nonpregnant women and 2 drinks a day for men. One drink equals 12 oz of beer, 5 oz of wine, or 1 oz of hard liquor. General instructions  Keep a weight loss journal to keep track of the food you eat and how much you exercise you get.  Take over-the-counter and prescription medicines only as told by your health care provider.  Take vitamins and supplements only as told by your health care provider.  Consider joining a support group. Your health care provider may be able to recommend a support group.  Keep all follow-up visits as told by your health care provider. This is important. Contact a health care provider if:  You are unable to meet your weight loss goal after 6 weeks of dietary and lifestyle changes. This information is not intended to replace advice given to you by your health care provider. Make sure you discuss any questions you have with your health care provider. Document Released: 05/31/2004 Document Revised: 09/26/2015 Document Reviewed: 02/09/2015 Elsevier Interactive Patient Education  2018 [employer]. Hypertension Hypertension is another name for high blood pressure. High blood pressure forces your heart to work harder to pump blood. This can cause problems over time. There are two numbers in a blood pressure reading. There is a top number (systolic) over a bottom number (diastolic). It is best to have a blood pressure below 120/80. Healthy choices can help lower your blood pressure. You may need medicine to help lower your blood pressure if:  Your blood pressure cannot be lowered with healthy choices.  Your blood pressure is higher than 130/80.  Follow these instructions at home: Eating and drinking  If directed, follow the DASH eating plan. This  diet includes: ? Filling half of your plate at each meal with fruits and vegetables. ? Filling one quarter of your plate at each meal with whole grains. Whole grains include whole wheat pasta, brown rice, and whole grain bread. ? Eating or drinking low-fat dairy products, such as skim milk or low-fat yogurt. ? Filling one quarter of your plate at each meal with low-fat (lean) proteins. Low-fat proteins include fish, skinless chicken, eggs, beans, and tofu. ? Avoiding fatty meat, cured and processed meat, or chicken with skin. ? Avoiding premade or processed food.  Eat less than 1,500 mg of salt (sodium) a day.  Limit alcohol use to no more than 1 drink a day for nonpregnant women and 2 drinks a day for men. One drink equals 12 oz of beer, 5 oz of wine, or 1 oz of hard liquor. Lifestyle  Work with your doctor to stay at a healthy weight or to lose weight. Ask your doctor what the best weight is for you.  Get at least 30 minutes of exercise that causes your heart to beat faster (aerobic exercise) most days of the week. This may include walking, swimming, or biking.  Get at least 30 minutes of exercise that strengthens your muscles (resistance exercise) at least 3 days a week. This may include lifting weights or pilates.  Do not use any products that contain nicotine  or tobacco. This includes cigarettes and e-cigarettes. If you need help quitting, ask your doctor.  Check your blood pressure at home as told by your doctor.  Keep all follow-up visits as told by your doctor. This is important. Medicines  Take over-the-counter and prescription medicines only as told by your doctor. Follow directions carefully.  Do not skip doses of blood pressure medicine. The medicine does not work as well if you skip doses. Skipping doses also puts you at risk for problems.  Ask your doctor about side effects or reactions to medicines that you should watch for. Contact a doctor if:  You think you are  having a reaction to the medicine you are taking.  You have headaches that keep coming back (recurring).  You feel dizzy.  You have swelling in your ankles.  You have trouble with your vision. Get help right away if:  You get a very bad headache.  You start to feel confused.  You feel weak or numb.  You feel faint.  You get very bad pain in your: ? Chest. ? Belly (abdomen).  You throw up (vomit) more than once.  You have trouble breathing. Summary  Hypertension is another name for high blood pressure.  Making healthy choices can help lower blood pressure. If your blood pressure cannot be controlled with healthy choices, you may need to take medicine. This information is not intended to replace advice given to you by your health care provider. Make sure you discuss any questions you have with your health care provider. Document Released: 10/10/2007 Document Revised: 03/21/2016 Document Reviewed: 03/21/2016 Elsevier Interactive Patient Education  2018 [employer]. Diarrhea, Adult Diarrhea is frequent loose and watery bowel movements. Diarrhea can make you feel weak and cause you to become dehydrated. Dehydration can make you tired and thirsty, cause you to have a dry mouth, and decrease how often you urinate. Diarrhea typically lasts 2-3 days. However, it can last longer if it is a sign of something more serious. It is important to treat your diarrhea as told by your health care provider. Follow these instructions at home: Eating and drinking  Follow these recommendations as told by your health care provider:  Take an oral rehydration solution (ORS). This is a drink that is sold at pharmacies and retail stores.  Drink clear fluids, such as water, ice chips, diluted fruit juice, and low-calorie sports drinks.  Eat bland, easy-to-digest foods in small amounts as you are able. These foods include bananas, applesauce, rice, lean meats, toast, and crackers.  Avoid drinking  fluids that contain a lot of sugar or caffeine, such as energy drinks, sports drinks, and soda.  Avoid alcohol.  Avoid spicy or fatty foods.  General instructions  Drink enough fluid to keep your urine clear or pale yellow.  Wash your hands often. If soap and water are not available, use hand sanitizer.  Make sure that all people in your household wash their hands well and often.  Take over-the-counter and prescription medicines only as told by your health care provider.  Rest at home while you recover.  Watch your condition for any changes.  Take a warm bath to relieve any burning or pain from frequent diarrhea episodes.  Keep all follow-up visits as told by your health care provider. This is important. Contact a health care provider if:  You have a fever.  Your diarrhea gets worse.  You have new symptoms.  You cannot keep fluids down.  You feel light-headed or dizzy.  You have a headache  You have muscle cramps. Get help right away if:  You have chest pain.  You feel extremely weak or you faint.  You have bloody or black stools or stools that look like tar.  You have severe pain, cramping, or bloating in your abdomen.  You have trouble breathing or you are breathing very quickly.  Your heart is beating very quickly.  Your skin feels cold and clammy.  You feel confused.  You have signs of dehydration, such as: ? Dark urine, very little urine, or no urine. ? Cracked lips. ? Dry mouth. ? Sunken eyes. ? Sleepiness. ? Weakness. This information is not intended to replace advice given to you by your health care provider. Make sure you discuss any questions you have with your health care provider. Document Released: 04/13/2002 Document Revised: 09/01/2015 Document Reviewed: 12/28/2014 Elsevier Interactive Patient Education  2018 [employer]. Viral Gastroenteritis, Adult Viral gastroenteritis is also known as the stomach flu. This condition is caused by  certain germs (viruses). These germs can be passed from person to person very easily (are very contagious). This condition can cause sudden watery poop (diarrhea), fever, and throwing up (vomiting). Having watery poop and throwing up can make you feel weak and cause you to get dehydrated. Dehydration can make you tired and thirsty, make you have a dry mouth, and make it so you pee (urinate) less often. Older adults and people with other diseases or a weak defense system (immune system) are at higher risk for dehydration. It is important to replace the fluids that you lose from having watery poop and throwing up. Follow these instructions at home: Follow instructions from your doctor about how to care for yourself at home. Eating and drinking  Follow these instructions as told by your doctor:  Take an oral rehydration solution (ORS). This is a drink that is sold at pharmacies and stores.  Drink clear fluids in small amounts as you are able, such as: ? Water. ? Ice chips. ? Diluted fruit juice. ? Low-calorie sports drinks.  Eat bland, easy-to-digest foods in small amounts as you are able, such as: ? Bananas. ? Applesauce. ? Rice. ? Low-fat (lean) meats. ? Toast. ? Crackers.  Avoid fluids that have a lot of sugar or caffeine in them.  Avoid alcohol.  Avoid spicy or fatty foods.  General instructions  Drink enough fluid to keep your pee (urine) clear or pale yellow.  Wash your hands often. If you cannot use soap and water, use hand sanitizer.  Make sure that all people in your home wash their hands well and often.  Rest at home while you get better.  Take over-the-counter and prescription medicines only as told by your doctor.  Watch your condition for any changes.  Take a warm bath to help with any burning or pain from having watery poop.  Keep all follow-up visits as told by your doctor. This is important. Contact a doctor if:  You cannot keep fluids down.  Your  symptoms get worse.  You have new symptoms.  You feel light-headed or dizzy.  You have muscle cramps. Get help right away if:  You have chest pain.  You feel very weak or you pass out (faint).  You see blood in your throw-up.  Your throw-up looks like coffee grounds.  You have bloody or black poop (stools) or poop that look like tar.  You have a very bad headache, a stiff neck, or both.  You  have a rash.  You have very bad pain, cramping, or bloating in your belly (abdomen).  You have trouble breathing.  You are breathing very quickly.  Your heart is beating very quickly.  Your skin feels cold and clammy.  You feel confused.  You have pain when you pee.  You have signs of dehydration, such as: ? Dark pee, hardly any pee, or no pee. ? Cracked lips. ? Dry mouth. ? Sunken eyes. ? Sleepiness. ? Weakness. This information is not intended to replace advice given to you by your health care provider. Make sure you discuss any questions you have with your health care provider. Document Released: 10/10/2007 Document Revised: 11/11/2015 Document Reviewed: 12/28/2014 Elsevier Interactive Patient Education  2017 [employer] Dehydration, Adult Dehydration is when there is not enough fluid or water in your body. This happens when you lose more fluids than you take in. Dehydration can range from mild to very bad. It should be treated right away to keep it from getting very bad. Symptoms of mild dehydration may include:  Thirst.  Dry lips.  Slightly dry mouth.  Dry, warm skin.  Dizziness. Symptoms of moderate dehydration may include:  Very dry mouth.  Muscle cramps.  Dark pee (urine). Pee may be the color of tea.  Your body making less pee.  Your eyes making fewer tears.  Heartbeat that is uneven or faster than normal (palpitations).  Headache.  Light-headedness, especially when you stand up from sitting.  Fainting (syncope). Symptoms of very bad  dehydration may include:  Changes in skin, such as: ? Cold and clammy skin. ? Blotchy (mottled) or pale skin. ? Skin that does not quickly return to normal after being lightly pinched and let go (poor skin turgor).  Changes in body fluids, such as: ? Feeling very thirsty. ? Your eyes making fewer tears. ? Not sweating when body temperature is high, such as in hot weather. ? Your body making very little pee.  Changes in vital signs, such as: ? Weak pulse. ? Pulse that is more than 100 beats a minute when you are sitting still. ? Fast breathing. ? Low blood pressure.  Other changes, such as: ? Sunken eyes. ? Cold hands and feet. ? Confusion. ? Lack of energy (lethargy). ? Trouble waking up from sleep. ? Short-term weight loss. ? Unconsciousness. Follow these instructions at home:  If told by your doctor, drink an ORS: ? Make an ORS by using instructions on the package. ? Start by drinking small amounts, about  cup (120 mL) every 5-10 minutes. ? Slowly drink more until you have had the amount that your doctor said to have.  Drink enough clear fluid to keep your pee clear or pale yellow. If you were told to drink an ORS, finish the ORS first, then start slowly drinking clear fluids. Drink fluids such as: ? Water. Do not drink only water by itself. Doing that can make the salt (sodium) level in your body get too low (hyponatremia). ? Ice chips. ? Fruit juice that you have added water to (diluted). ? Low-calorie sports drinks.  Avoid: ? Alcohol. ? Drinks that have a lot of sugar. These include high-calorie sports drinks, fruit juice that does not have water added, and soda. ? Caffeine. ? Foods that are greasy or have a lot of fat or sugar.  Take over-the-counter and prescription medicines only as told by your doctor.  Do not take salt tablets. Doing that can make the salt level in your  body get too high (hypernatremia).  Eat foods that have minerals (electrolytes).  Examples include bananas, oranges, potatoes, tomatoes, and spinach.  Keep all follow-up visits as told by your doctor. This is important. Contact a doctor if:  You have belly (abdominal) pain that: ? Gets worse. ? Stays in one area (localizes).  You have a rash.  You have a stiff neck.  You get angry or annoyed more easily than normal (irritability).  You are more sleepy than normal.  You have a harder time waking up than normal.  You feel: ? Weak. ? Dizzy. ? Very thirsty.  You have peed (urinated) only a small amount of very dark pee during 6-8 hours. Get help right away if:  You have symptoms of very bad dehydration.  You cannot drink fluids without throwing up (vomiting).  Your symptoms get worse with treatment.  You have a fever.  You have a very bad headache.  You are throwing up or having watery poop (diarrhea) and it: ? Gets worse. ? Does not go away.  You have blood or something green (bile) in your throw-up.  You have blood in your poop (stool). This may cause poop to look black and tarry.  You have not peed in 6-8 hours.  You pass out (faint).  Your heart rate when you are sitting still is more than 100 beats a minute.  You have trouble breathing. This information is not intended to replace advice given to you by your health care provider. Make sure you discuss any questions you have with your health care provider. Document Released: 02/17/2009 Document Revised: 11/11/2015 Document Reviewed: 06/17/2015 Elsevier Interactive Patient Education  2018 [employer].

## 2019-03-03 ENCOUNTER — Other Ambulatory Visit: Payer: Self-pay | Admitting: Registered Nurse

## 2019-03-03 NOTE — Telephone Encounter (Signed)
Flonase refill request electronically approved for patient.

## 2019-08-08 ENCOUNTER — Ambulatory Visit: Payer: Self-pay

## 2019-08-28 ENCOUNTER — Other Ambulatory Visit: Payer: Self-pay | Admitting: Registered Nurse

## 2019-08-28 ENCOUNTER — Encounter: Payer: Self-pay | Admitting: Registered Nurse

## 2019-08-28 DIAGNOSIS — J301 Allergic rhinitis due to pollen: Secondary | ICD-10-CM | POA: Insufficient documentation

## 2019-08-28 NOTE — Telephone Encounter (Signed)
Patient last seen 04/2018  flonase working well for her refill approved electronically F/u prn

## 2019-08-28 NOTE — Telephone Encounter (Signed)
Pt confirmed needing flonase. Refill approved.

## 2019-08-28 NOTE — Addendum Note (Signed)
Addended by: Albina Billet A on: 08/28/2019 11:45 AM   Modules accepted: Level of Service

## 2019-08-28 NOTE — Telephone Encounter (Signed)
LVM for pt to return call to clinic.

## 2019-08-28 NOTE — Telephone Encounter (Signed)
Please check if she needs

## 2019-12-24 ENCOUNTER — Telehealth: Payer: Self-pay | Admitting: Registered Nurse

## 2019-12-24 ENCOUNTER — Encounter: Payer: Self-pay | Admitting: Registered Nurse

## 2019-12-24 ENCOUNTER — Ambulatory Visit: Payer: Self-pay | Admitting: *Deleted

## 2019-12-24 ENCOUNTER — Other Ambulatory Visit: Payer: Self-pay

## 2019-12-24 DIAGNOSIS — Z23 Encounter for immunization: Secondary | ICD-10-CM

## 2019-12-24 NOTE — Telephone Encounter (Signed)
Notified by patient supervisor injury at work yesterday skin cut.  No tetanus noted in Epic or paper chart at [facility].  RN [name] contacted patient via telephone to determine last tetanus vaccine and patient cannot remember having one recently.  Recommended booster.  RN [name] scheduled appt with patient for tetanus booster.

## 2019-12-24 NOTE — Progress Notes (Signed)
TDap after minor cut to thumb. No known history of last Tdap booster.

## 2019-12-25 NOTE — Telephone Encounter (Signed)
noted 

## 2019-12-25 NOTE — Telephone Encounter (Signed)
TDap administered 12/24/19 and recorded in [facility] Immunizations

## 2020-04-18 ENCOUNTER — Other Ambulatory Visit: Payer: Self-pay | Admitting: *Deleted

## 2020-04-18 MED ORDER — FLUOCINONIDE EMULSIFIED BASE 0.05 % EX CREA: 1.0000 "application " | TOPICAL_CREAM | Freq: Two times a day (BID) | CUTANEOUS | 0 refills | Status: DC

## 2020-04-18 NOTE — Telephone Encounter (Signed)
Pt requests Lidex-E cream refill. Uses sparingly for her eczema. Last filled 2018. Reports she still has some at home but is out of date and would like a new tube. Confirmed preferred pharmacy. Refill placed.

## 2020-10-13 ENCOUNTER — Ambulatory Visit: Payer: Self-pay | Admitting: *Deleted

## 2020-10-13 ENCOUNTER — Other Ambulatory Visit: Payer: Self-pay

## 2020-10-13 VITALS — BP 168/98 | Ht 68.5 in | Wt 275.0 lb

## 2020-10-13 DIAGNOSIS — Z0000 Encounter for general adult medical examination without abnormal findings: Secondary | ICD-10-CM

## 2020-10-13 NOTE — Progress Notes (Signed)
[employer] insurance premium discount evaluation: Labs Drawn. Replacements ROI form signed. Tobacco Free Attestation form signed.  Forms placed in paper chart.  

## 2020-10-14 ENCOUNTER — Encounter: Payer: Self-pay | Admitting: Registered Nurse

## 2020-10-14 LAB — CMP12+LP+TP+TSH+6AC+CBC/D/PLT
ALT: 12 IU/L (ref 0–32)
AST: 14 IU/L (ref 0–40)
Albumin/Globulin Ratio: 1.6 (ref 1.2–2.2)
Albumin: 4.5 g/dL (ref 3.8–4.9)
Alkaline Phosphatase: 101 IU/L (ref 44–121)
BUN/Creatinine Ratio: 13 (ref 12–28)
BUN: 12 mg/dL (ref 8–27)
Basophils Absolute: 0 10*3/uL (ref 0.0–0.2)
Basos: 1 %
Bilirubin Total: 0.7 mg/dL (ref 0.0–1.2)
Calcium: 9.4 mg/dL (ref 8.7–10.3)
Chloride: 104 mmol/L (ref 96–106)
Chol/HDL Ratio: 3.1 ratio (ref 0.0–4.4)
Cholesterol, Total: 214 mg/dL — ABNORMAL HIGH (ref 100–199)
Creatinine, Ser: 0.93 mg/dL (ref 0.57–1.00)
EOS (ABSOLUTE): 0.2 10*3/uL (ref 0.0–0.4)
Eos: 3 %
Estimated CHD Risk: <0.5 times avg. (ref 0.0–1.0)
Free Thyroxine Index: 2.1 (ref 1.2–4.9)
GGT: 12 IU/L (ref 0–60)
Globulin, Total: 2.9 g/dL (ref 1.5–4.5)
Glucose: 90 mg/dL (ref 65–99)
HDL: 68 mg/dL (ref >39)
Hematocrit: 40.2 % (ref 34.0–46.6)
Hemoglobin: 13.3 g/dL (ref 11.1–15.9)
Immature Grans (Abs): 0 10*3/uL (ref 0.0–0.1)
Immature Granulocytes: 0 %
Iron: 110 ug/dL (ref 27–159)
LDH: 194 IU/L (ref 119–226)
LDL Chol Calc (NIH): 135 mg/dL — ABNORMAL HIGH (ref 0–99)
Lymphocytes Absolute: 2.7 10*3/uL (ref 0.7–3.1)
Lymphs: 51 %
MCH: 31.2 pg (ref 26.6–33.0)
MCHC: 33.1 g/dL (ref 31.5–35.7)
MCV: 94 fL (ref 79–97)
Monocytes Absolute: 0.4 10*3/uL (ref 0.1–0.9)
Monocytes: 8 %
Neutrophils Absolute: 1.9 10*3/uL (ref 1.4–7.0)
Neutrophils: 37 %
Phosphorus: 2.9 mg/dL — ABNORMAL LOW (ref 3.0–4.3)
Platelets: 213 10*3/uL (ref 150–450)
Potassium: 3.9 mmol/L (ref 3.5–5.2)
RBC: 4.26 x10E6/uL (ref 3.77–5.28)
RDW: 12.5 % (ref 11.7–15.4)
Sodium: 140 mmol/L (ref 134–144)
T3 Uptake Ratio: 27 % (ref 24–39)
T4, Total: 7.6 ug/dL (ref 4.5–12.0)
TSH: 2.09 u[IU]/mL (ref 0.450–4.500)
Total Protein: 7.4 g/dL (ref 6.0–8.5)
Triglycerides: 62 mg/dL (ref 0–149)
Uric Acid: 5.9 mg/dL (ref 3.0–7.2)
VLDL Cholesterol Cal: 11 mg/dL (ref 5–40)
WBC: 5.3 10*3/uL (ref 3.4–10.8)
eGFR: 70 mL/min/{1.73_m2} (ref >59)

## 2020-10-14 LAB — HGB A1C W/O EAG: Hgb A1c MFr Bld: 5.2 % (ref 4.8–5.6)

## 2020-10-17 LAB — VITAMIN D 25 HYDROXY (VIT D DEFICIENCY, FRACTURES): Vit D, 25-Hydroxy: 31.1 ng/mL (ref 30.0–100.0)

## 2020-10-17 LAB — SPECIMEN STATUS REPORT

## 2020-11-17 ENCOUNTER — Encounter: Payer: Self-pay | Admitting: Registered Nurse

## 2020-11-17 ENCOUNTER — Telehealth: Payer: Self-pay | Admitting: Registered Nurse

## 2020-11-17 NOTE — Telephone Encounter (Signed)
Kathleen Silva patient's supervisor notified me coworked of patient reported mother with positive covid test and at party together prior to positive test this week. Asking if patient needs to be sent home from work to quarantine.  Coworker exposure did not meet close contact exposure.  So no testing/quarantine required at this time per [facility] recommendations.  On chart review noted that patient has not received covid booster vaccine/not up to date.  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  Positive test rate in county 15% and increasing hospitalizations due to new variant.  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  Does not meet close contact criteria but recommended patient wear mask at work and when around others due to not up to date on vaccines/increased positive test rate/new variants in circulation.  No quarantine or testing indicated at this time.  HR notified along with supervisor [name].  Discussed with patient current variant symptoms can mimic allergies/cold and high fever/loss of taste/smell not typical of current covid variant.  Usually congestion/sore throat/rhinitis now.  Patient alert and oriented x3, spoke full sentences without difficulty.  No runny nose/throat clearing/nasal congestion or cough when in clinic noted.  Discussed with patient she can contact me at this number [phone] if questions when clinic closed or concerns until RN [name] returns to clinic on Monday 18 July [phone].   Pt verbalized understanding and agreement with plan of care. No further questions/concerns at this time.

## 2021-10-12 ENCOUNTER — Other Ambulatory Visit: Payer: Self-pay | Admitting: Registered Nurse

## 2021-10-13 LAB — CMP12+LP+TP+TSH+6AC+CBC/D/PLT
ALT: 16 IU/L (ref 0–32)
AST: 20 IU/L (ref 0–40)
Albumin/Globulin Ratio: 1.7 (ref 1.2–2.2)
Albumin: 4.5 g/dL (ref 3.8–4.8)
Alkaline Phosphatase: 94 IU/L (ref 44–121)
BUN/Creatinine Ratio: 17 (ref 12–28)
BUN: 14 mg/dL (ref 8–27)
Basophils Absolute: 0 10*3/uL (ref 0.0–0.2)
Basos: 1 %
Bilirubin Total: 0.8 mg/dL (ref 0.0–1.2)
Calcium: 10 mg/dL (ref 8.7–10.3)
Chloride: 102 mmol/L (ref 96–106)
Chol/HDL Ratio: 2.7 ratio (ref 0.0–4.4)
Cholesterol, Total: 213 mg/dL — ABNORMAL HIGH (ref 100–199)
Creatinine, Ser: 0.83 mg/dL (ref 0.57–1.00)
EOS (ABSOLUTE): 0.2 10*3/uL (ref 0.0–0.4)
Eos: 3 %
Estimated CHD Risk: <0.5 times avg. (ref 0.0–1.0)
Free Thyroxine Index: 1.8 (ref 1.2–4.9)
GGT: 12 IU/L (ref 0–60)
Globulin, Total: 2.7 g/dL (ref 1.5–4.5)
Glucose: 102 mg/dL — ABNORMAL HIGH (ref 70–99)
HDL: 79 mg/dL (ref >39)
Hematocrit: 39.8 % (ref 34.0–46.6)
Hemoglobin: 13.4 g/dL (ref 11.1–15.9)
Immature Grans (Abs): 0 10*3/uL (ref 0.0–0.1)
Immature Granulocytes: 0 %
Iron: 80 ug/dL (ref 27–139)
LDH: 188 IU/L (ref 119–226)
LDL Chol Calc (NIH): 124 mg/dL — ABNORMAL HIGH (ref 0–99)
Lymphocytes Absolute: 2.8 10*3/uL (ref 0.7–3.1)
Lymphs: 47 %
MCH: 31.3 pg (ref 26.6–33.0)
MCHC: 33.7 g/dL (ref 31.5–35.7)
MCV: 93 fL (ref 79–97)
Monocytes Absolute: 0.5 10*3/uL (ref 0.1–0.9)
Monocytes: 8 %
Neutrophils Absolute: 2.4 10*3/uL (ref 1.4–7.0)
Neutrophils: 41 %
Phosphorus: 3.6 mg/dL (ref 3.0–4.3)
Platelets: 229 10*3/uL (ref 150–450)
Potassium: 3.9 mmol/L (ref 3.5–5.2)
RBC: 4.28 x10E6/uL (ref 3.77–5.28)
RDW: 12.4 % (ref 11.7–15.4)
Sodium: 140 mmol/L (ref 134–144)
T3 Uptake Ratio: 24 % (ref 24–39)
T4, Total: 7.6 ug/dL (ref 4.5–12.0)
TSH: 2.87 u[IU]/mL (ref 0.450–4.500)
Total Protein: 7.2 g/dL (ref 6.0–8.5)
Triglycerides: 57 mg/dL (ref 0–149)
Uric Acid: 6.5 mg/dL (ref 3.0–7.2)
VLDL Cholesterol Cal: 10 mg/dL (ref 5–40)
WBC: 5.8 10*3/uL (ref 3.4–10.8)
eGFR: 80 mL/min/{1.73_m2} (ref >59)

## 2021-10-13 LAB — HGB A1C W/O EAG: Hgb A1c MFr Bld: 5 % (ref 4.8–5.6)

## 2021-10-13 LAB — VITAMIN D 25 HYDROXY (VIT D DEFICIENCY, FRACTURES): Vit D, 25-Hydroxy: 30.5 ng/mL (ref 30.0–100.0)

## 2021-10-15 ENCOUNTER — Encounter: Payer: Self-pay | Admitting: Registered Nurse

## 2021-10-23 ENCOUNTER — Ambulatory Visit: Payer: Self-pay | Admitting: *Deleted

## 2021-10-23 VITALS — BP 145/89 | HR 98 | Ht 68.5 in | Wt 279.0 lb

## 2021-10-23 DIAGNOSIS — Z0000 Encounter for general adult medical examination without abnormal findings: Secondary | ICD-10-CM

## 2021-10-23 NOTE — Progress Notes (Signed)
[employer] insurance premium discount evaluation: Labs Drawn by [facility] onsite previously. [employer] ROI form signed. Tobacco Free Attestation form signed.  Forms placed in paper chart.  

## 2021-10-24 ENCOUNTER — Ambulatory Visit: Payer: Self-pay | Admitting: Registered Nurse

## 2021-10-24 ENCOUNTER — Encounter: Payer: Self-pay | Admitting: Registered Nurse

## 2021-10-24 VITALS — BP 139/106 | HR 93 | Temp 97.5°F

## 2021-10-24 DIAGNOSIS — M79644 Pain in right finger(s): Secondary | ICD-10-CM

## 2021-10-24 DIAGNOSIS — R030 Elevated blood-pressure reading, without diagnosis of hypertension: Secondary | ICD-10-CM

## 2021-10-24 MED ORDER — DICLOFENAC SODIUM 75 MG PO TBEC: 75.0000 mg | DELAYED_RELEASE_TABLET | Freq: Two times a day (BID) | ORAL | 0 refills | Status: AC

## 2021-10-24 NOTE — Patient Instructions (Signed)
Trigger Finger  Trigger finger, also called stenosing tenosynovitis,  is a condition that causes a finger to get stuck in a bent position. Each finger has a tendon, which is a tough, cord-like tissue that connects muscle to bone, and each tendon passes through a tunnel of tissue called a tendon sheath. To move your finger, your tendon needs to glide freely through the sheath. Trigger finger happens when the tendon or the sheath thickens, making it difficult to move your finger. Trigger finger can affect any finger or a thumb. It may affect more than one finger. Mild cases may clear up with rest and medicine. Severe cases require more treatment. What are the causes? Trigger finger is caused by a thickened finger tendon or tendon sheath. The cause of this thickening is not known. What increases the risk? The following factors may make you more likely to develop this condition: Doing activities that require a strong grip. Having rheumatoid arthritis, gout, or diabetes. Being 40-60 years old. Being female. What are the signs or symptoms? Symptoms of this condition include: Pain when bending or straightening your finger. Tenderness or swelling where your finger attaches to the palm of your hand. A lump in the palm of your hand or on the inside of your finger. Hearing a noise like a pop or a snap when you try to straighten your finger. Feeling a catching or locking sensation when you try to straighten your finger. Being unable to straighten your finger. How is this diagnosed? This condition is diagnosed based on your symptoms and a physical exam. How is this treated? This condition may be treated by: Resting your finger and avoiding activities that make symptoms worse. Wearing a finger splint to keep your finger extended. Taking NSAIDs, such as ibuprofen, to relieve pain and swelling. Doing gentle exercises to stretch the finger as told by your health care provider. Having medicine that reduces  swelling and inflammation (steroids) injected into the tendon sheath. Injections may need to be repeated. Having surgery to open the tendon sheath. This may be done if other treatments do not work and you cannot straighten your finger. You may need physical therapy after surgery. Follow these instructions at home: If you have a splint: Wear the splint as told by your health care provider. Remove it only as told by your health care provider. Loosen it if your fingers tingle, become numb, or turn cold and blue. Keep it clean. If the splint is not waterproof: Do not let it get wet. Cover it with a watertight covering when you take a bath or shower. Managing pain, stiffness, and swelling     If directed, apply heat to the affected area as often as told by your health care provider. Use the heat source that your health care provider recommends, such as a moist heat pack or a heating pad. Place a towel between your skin and the heat source. Leave the heat on for 20-30 minutes. Remove the heat if your skin turns bright red. This is especially important if you are unable to feel pain, heat, or cold. You may have a greater risk of getting burned. If directed, put ice on the painful area. To do this: If you have a removable splint, remove it as told by your health care provider. Put ice in a plastic bag. Place a towel between your skin and the bag or between your splint and the bag. Leave the ice on for 20 minutes, 2-3 times a day.  Activity Rest   your finger as told by your health care provider. Avoid activities that make the pain worse. Return to your normal activities as told by your health care provider. Ask your health care provider what activities are safe for you. Do exercises as told by your health care provider. Ask your health care provider when it is safe to drive if you have a splint on your hand. General instructions Take over-the-counter and prescription medicines only as told by  your health care provider. Keep all follow-up visits as told by your health care provider. This is important. Contact a health care provider if: Your symptoms are not improving with home care. Summary Trigger finger, also called stenosing tenosynovitis, causes your finger to get stuck in a bent position. This can make it difficult and painful to straighten your finger. This condition develops when a finger tendon or tendon sheath thickens. Treatment may include resting your finger, wearing a splint, and taking medicines. In severe cases, surgery to open the tendon sheath may be needed. This information is not intended to replace advice given to you by your health care provider. Make sure you discuss any questions you have with your health care provider. Document Revised: 09/08/2018 Document Reviewed: 09/08/2018 Elsevier Patient Education  2023 [employer] Arthritis Arthritis is a term that is commonly used to refer to joint pain or joint disease. There are more than 100 types of arthritis. What are the causes? The most common cause of this condition is wear and tear of a joint. Other causes include: Gout. Inflammation of a joint. An infection of a joint. Sprains and other injuries near the joint. A reaction to medicines or drugs, or an allergic reaction. In some cases, the cause may not be known. What are the signs or symptoms? The main symptom of this condition is pain in the joint during movement. Other symptoms include: Redness, swelling, or stiffness at a joint. Warmth coming from the joint. Fever. Overall feeling of illness. How is this diagnosed? This condition may be diagnosed with a physical exam and tests, including: Blood tests. Urine tests. Imaging tests, such as X-rays, an MRI, or a CT scan. Sometimes, fluid is removed from a joint for testing. How is this treated? This condition may be treated with: Treatment of the cause, if it is known. Rest. Raising  (elevating) the joint. Applying cold or hot packs to the joint. Medicines to improve symptoms and reduce inflammation. Injections of a steroid, such as cortisone, into the joint to help reduce pain and inflammation. Depending on the cause of your arthritis, you may need to make lifestyle changes to reduce stress on your joint. Changes may include: Exercising more. Losing weight. Follow these instructions at home: Medicines Take over-the-counter and prescription medicines only as told by your health care provider. Do not take aspirin to relieve pain if your health care provider thinks that gout may be causing your pain. Activity Rest your joint if told by your health care provider. Rest is important when your disease is active and your joint feels painful, swollen, or stiff. Avoid activities that make the pain worse. Balance activity with rest. Exercise your joint regularly with range-of-motion exercises as told by your health care provider. Try doing low-impact exercise, such as: Swimming. Water aerobics. Biking. Walking. Managing pain, stiffness, and swelling     If directed, put ice on the affected joint. To do this: Put ice in a plastic bag. Place a towel between your skin and the bag. Leave the ice on  for 20 minutes, 2-3 times a day. Remove the ice if your skin turns bright red. This is very important. If you cannot feel pain, heat, or cold, you have a greater risk of damage to the area. If your joint is swollen, raise (elevate) it above the level of your heart if directed by your health care provider. If your joint feels stiff in the morning, try taking a warm shower. If directed, apply heat to the affected area as often as told by your health care provider. Use the heat source that your health care provider recommends, such as a moist heat pack or a heating pad. To apply heat: Place a towel between your skin and the heat source. Leave the heat on for 20-30 minutes. Remove the  heat if your skin turns bright red. This is especially important if you are unable to feel pain, heat, or cold. You have a greater risk of getting burned. General instructions Maintain a healthy weight. Follow instructions from your health care provider for weight control. Do not use any products that contain nicotine or tobacco. These products include cigarettes, chewing tobacco, and vaping devices, such as e-cigarettes. If you need help quitting, ask your health care provider. Keep all follow-up visits. This is important. Where to find more information [employer]: www.niams.http://www.myers.net/ Contact a health care provider if: The pain gets worse. You have a fever. Get help right away if: You develop severe joint pain, swelling, or redness. Many joints become painful and swollen. You develop severe back pain. You develop severe weakness in your leg. Summary Arthritis is a term that is commonly used to refer to joint pain or joint disease. There are more than 100 types of arthritis. The most common cause of this condition is wear and tear of a joint. Other causes include gout, inflammation or infection of the joint, sprains, or allergies. Symptoms of this condition include redness, swelling, or stiffness of the joint. Other symptoms include warmth, fever, or feeling ill. This condition is treated with rest, elevation, medicines, and applying cold or hot packs. Follow your health care provider's instructions about medicines, activity, exercises, and other home care treatments. This information is not intended to replace advice given to you by your health care provider. Make sure you discuss any questions you have with your health care provider. Document Revised: 01/31/2021 Document Reviewed: 01/31/2021 Elsevier Patient Education  2023 Elsevier Inc. Ganglion Cyst  A ganglion cyst is a non-cancerous, fluid-filled lump of tissue that occurs near a joint, tendon, or ligament. The cyst  grows out of a joint or the lining of a tendon or ligament. Ganglion cysts most often develop in the hand or wrist, but they can also develop in the shoulder, elbow, hip, knee, ankle, or foot. Ganglion cysts are ball-shaped or egg-shaped. Their size can range from the size of a pea to larger than a grape. Increased activity may cause the cyst to get bigger because more fluid starts to build up. What are the causes? The exact cause of this condition is not known, but it may be related to: Inflammation or irritation around the joint. An injury or tear in the layers of tissue around the joint (joint capsule). Repetitive movements or overuse. History of acute or repeated injury. What increases the risk? You are more likely to develop this condition if: You are a female. You are 12-73 years old. What are the signs or symptoms? The main symptom of this condition is a lump. It most  often appears on the hand or wrist. In many cases, there are no other symptoms, but a cyst can sometimes cause: Tingling. Pain or tenderness. Numbness. Weakness or loss of strength in the affected joint. Decreased range of motion in the affected area of the body. How is this diagnosed? Ganglion cysts are usually diagnosed based on a physical exam. Your health care provider will feel the lump and may shine a light next to it. If it is a ganglion cyst, the light will likely shine through it. Your health care provider may order an X-ray, ultrasound, MRI, or CT scan to rule out other conditions. How is this treated? Ganglion cysts often go away on their own without treatment. If you have pain or other symptoms, treatment may be needed. Treatment is also needed if the ganglion cyst limits your movement or if it gets infected. Treatment may include: Wearing a brace or splint on your wrist or finger. Taking anti-inflammatory medicine. Having fluid drained from the lump with a needle (aspiration). Getting an injection of  medicine into the joint to decrease inflammation. This may be corticosteroids, ethanol, or hyaluronidase. Having surgery to remove the ganglion cyst. Placing a pad in your shoe or wearing shoes that will not rub against the cyst if it is on your foot. Follow these instructions at home: Do not press on the ganglion cyst, poke it with a needle, or hit it. Take over-the-counter and prescription medicines only as told by your health care provider. If you have a brace or splint: Wear it as told by your health care provider. Remove it as told by your health care provider. Ask if you need to remove it when you take a shower or a bath. Watch your ganglion cyst for any changes. Keep all follow-up visits as told by your health care provider. This is important. Contact a health care provider if: Your ganglion cyst becomes larger or more painful. You have pus coming from the lump. You have weakness or numbness in the affected area. You have a fever or chills. Get help right away if: You have a fever and have any of these in the cyst area: Increased redness. Red streaks. Swelling. Summary A ganglion cyst is a non-cancerous, fluid-filled lump that occurs near a joint, tendon, or ligament. Ganglion cysts most often develop in the hand or wrist, but they can also develop in the shoulder, elbow, hip, knee, ankle, or foot. Ganglion cysts often go away on their own without treatment. This information is not intended to replace advice given to you by your health care provider. Make sure you discuss any questions you have with your health care provider. Document Revised: 07/15/2019 Document Reviewed: 07/15/2019 Elsevier Patient Education  2023 [employer].

## 2021-10-24 NOTE — Progress Notes (Signed)
Subjective:    Patient ID: Kathleen Silva, female    DOB: [birth year], 61 y.o.   MRN: [id number]  61y/o african [place] female established patient c/o R middle finger pain x1 week. 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 Feels like finger will pop or catch. Has not had an episode of finger locking completely. Has noticed bump near end of finger can move it around and comes/goes.  Denied discharge/rash/bruising/trauma.  Knuckles middle and ring finger stiff in am/painful and sometimes swollen also.  Rubbing voltaren gel into hand at bedtime once a day helps some.  Running hot water over hand helps with stiffness and pain but not swelling.  Denied weakness in hands.  Right hand dominant.  Denied changes in activities has been in same position/job at [employer] 8 years.      Review of Systems  Constitutional:  Negative for activity change, appetite change, chills, diaphoresis, fatigue and fever.  HENT:  Negative for trouble swallowing and voice change.   Eyes:  Negative for visual disturbance.  Respiratory:  Negative for shortness of breath, wheezing and stridor.   Cardiovascular:  Negative for chest pain.  Gastrointestinal:  Negative for diarrhea, nausea and vomiting.  Endocrine: Negative for cold intolerance and heat intolerance.  Genitourinary:  Negative for difficulty urinating.  Musculoskeletal:  Positive for arthralgias, joint swelling and myalgias. Negative for gait problem, neck pain and neck stiffness.  Skin:  Negative for color change, pallor, rash and wound.  Allergic/Immunologic: Positive for environmental allergies. Negative for food allergies.  Neurological:  Negative for tremors, weakness and numbness.  Hematological:  Negative for adenopathy. Does not bruise/bleed easily.  Psychiatric/Behavioral:  Negative for agitation, confusion and sleep disturbance.        Objective:   Physical Exam Vitals and nursing note reviewed.   Constitutional:      General: She is awake. She is not in acute distress.    Appearance: Normal appearance. She is well-developed and well-groomed. She is obese. She is not ill-appearing, toxic-appearing or diaphoretic.  HENT:     Head: Normocephalic and atraumatic.     Jaw: There is normal jaw occlusion.     Salivary Glands: Right salivary gland is not diffusely enlarged. Left salivary gland is not diffusely enlarged.     Right Ear: Hearing and external ear normal.     Left Ear: Hearing and external ear normal.     Nose: Nose normal. No congestion or rhinorrhea.     Mouth/Throat:     Lips: Pink. No lesions.     Mouth: Mucous membranes are moist. No oral lesions or angioedema.     Dentition: No gum lesions.     Tongue: No lesions. Tongue does not deviate from midline.     Palate: No mass and lesions.     Pharynx: Oropharynx is clear. Uvula midline.  Eyes:     General: Lids are normal. Vision grossly intact. Gaze aligned appropriately. No allergic shiner or scleral icterus.       Right eye: No discharge.        Left eye: No discharge.     Extraocular Movements: Extraocular movements intact.     Conjunctiva/sclera: Conjunctivae normal.     Pupils: Pupils are equal, round, and reactive to light.  Neck:     Trachea: Trachea and phonation normal. No tracheal deviation.  Cardiovascular:     Rate and Rhythm: Normal rate and regular rhythm.     Pulses: Normal pulses.  Radial pulses are 2+ on the right side and 2+ on the left side.  Pulmonary:     Effort: Pulmonary effort is normal. No respiratory distress.     Breath sounds: Normal breath sounds and air entry. No stridor or transmitted upper airway sounds. No wheezing.     Comments: Spoke full sentences without difficulty; no cough observed in exam room Abdominal:     General: Abdomen is flat.  Musculoskeletal:        General: Swelling and tenderness present. No deformity or signs of injury. Normal range of motion.     Right  elbow: No swelling, deformity, effusion or lacerations. Normal range of motion. No tenderness.     Left elbow: No swelling, deformity, effusion or lacerations. Normal range of motion. No tenderness.     Right forearm: No swelling, edema, deformity, lacerations, tenderness or bony tenderness.     Left forearm: No swelling, edema, deformity, lacerations, tenderness or bony tenderness.     Right wrist: No swelling, deformity, effusion, lacerations, tenderness, bony tenderness or crepitus. Normal range of motion.     Left wrist: No swelling, deformity, effusion, lacerations, tenderness, bony tenderness or crepitus. Normal range of motion.     Right hand: Swelling and tenderness present. No deformity, lacerations or bony tenderness. Normal strength. Normal capillary refill.     Left hand: No swelling, deformity, lacerations, tenderness or bony tenderness. Normal strength. Normal capillary refill.       Arms:     Cervical back: Normal range of motion and neck supple. No edema, erythema, signs of trauma, rigidity or crepitus. No pain with movement. Normal range of motion.  Lymphadenopathy:     Head:     Right side of head: No submandibular or preauricular adenopathy.     Left side of head: No submandibular or preauricular adenopathy.     Cervical: No cervical adenopathy.     Right cervical: No superficial cervical adenopathy.    Left cervical: No superficial cervical adenopathy.  Skin:    General: Skin is warm and dry.     Capillary Refill: Capillary refill takes less than 2 seconds.     Coloration: Skin is not ashen, cyanotic, jaundiced, mottled, pale or sallow.     Findings: No abrasion, bruising, burn, erythema, signs of injury, laceration, lesion, petechiae, rash or wound.  Neurological:     General: No focal deficit present.     Mental Status: She is alert and oriented to person, place, and time. Mental status is at baseline.     Cranial Nerves: No cranial nerve deficit.     Motor: Motor  function is intact. No weakness, tremor, abnormal muscle tone or seizure activity.     Coordination: Coordination is intact. Coordination normal.     Gait: Gait is intact. Gait normal.     Comments: In/out of chair without difficulty; gait sure and steady in clinic; bilateral hand grasp equal 5/5  Psychiatric:        Attention and Perception: Attention and perception normal.        Mood and Affect: Mood and affect normal.        Speech: Speech normal.        Behavior: Behavior normal. Behavior is cooperative.        Thought Content: Thought content normal.        Cognition and Memory: Cognition and memory normal.        Judgment: Judgment normal.      Fitted and dispensed plastic  finger splint from clinic stock to patient for right 3rd digit PIP joint. 3 & 4th MCP joints posterior edema nonpitting 1/4 nontender no increased temperature no crepitus/locking/popping with arom strength 5/5 bilateral hands; full flexion/extension/abduction/adduction; slightly TTP posterior MCP joints and distal anterior phalanx 3rd right digit; phalanx edema nonpitting 0-1+/4 distal no nodule palpated    Assessment & Plan:  A-finger pain right and elevated blood pressure reading  P-Patient wants to try oral diclofenac 75mg  DR po BID #30 RF0 take with food dispensed from [facility] to patient and see if better relief than daily topical diclofenac.  Stop topical.  If not enough pain relief with diclofenac may add tylenol 1000mg  po QID prn pain given 4 UD from clinic stock.  Has ice pack and heating pad at home.  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  Distal 3rd finger trigger finger or ganglion cyst development suspected.  If no improved with conservative therapy/splint x 6 weeks will refer to orthopedics.  Follow up 6 Jul re-evaluation sooner if worsening with provider filling in next week as I am on vacation.  Discussed with patient if headache/visual  changes stop diclofenac oral and only take tylenol.  [facility] handouts trigger finger, ganglion cyst and arthritis printed and given to patient.  Patient verbalized understanding information/instructions, agreed with plan of care and had no further questions at this time.  To see RN [facility] Thursday for repeat blood pressure.  Discussed decrease caffeine to 1 serving 8 oz or less per day. Patient reported she is drinking the small soda cans currently.  Avoid added salts in diet. DASH diet/weight loss consider starting medication if no improvement with pain reduction. Discussed pain 6/10 today and topical diclofenac can increase her blood pressure.  Discussed tylenol 1000mg  po q6h prn use and stop diclofenac if headache develops. ER if worst headache of her life, visual changes, shortness of breath and/or chest pain.   Patient verbalized understanding information/instructions, agreed with plan of care and had no further questions at this time.

## 2021-10-26 NOTE — Progress Notes (Signed)
Noted reviewed RN [name] results note

## 2021-10-26 NOTE — Progress Notes (Signed)
Results reviewed with pt in clinic. Lipids slightly improved from last year. Pt has viewed labs and notes in MyChart. Diet and exercise recommendations discussed re: lipids, wt management. Routine f/u with pcp. Copy of labs provided to pt. Results routed to pcp per pt request. No further questions/concerns.

## 2021-11-28 ENCOUNTER — Encounter: Payer: Self-pay | Admitting: Registered Nurse

## 2021-11-28 ENCOUNTER — Ambulatory Visit: Payer: Self-pay | Admitting: Registered Nurse

## 2021-11-28 VITALS — BP 130/84 | HR 66 | Resp 16

## 2021-11-28 DIAGNOSIS — M546 Pain in thoracic spine: Secondary | ICD-10-CM

## 2021-11-28 NOTE — Patient Instructions (Signed)
Thoracic Strain Rehab Ask your health care provider which exercises are safe for you. Do exercises exactly as told by your health care provider and adjust them as directed. It is normal to feel mild stretching, pulling, tightness, or discomfort as you do these exercises. Stop right away if you feel sudden pain or your pain gets worse. Do not begin these exercises until told by your health care provider. Stretching and range-of-motion exercise This exercise warms up your muscles and joints and improves the movement and flexibility of your back and shoulders. This exercise also helps to relieve pain. Chest and spine stretch  Lie down on your back on a firm surface. Roll a towel or a small blanket so it is about 4 inches (10 cm) in diameter. Put the towel lengthwise under the middle of your back so it is under your spine, but not under your shoulder blades. Put your hands behind your head and let your elbows fall to your sides. This will increase your stretch. Take a deep breath (inhale). Hold for ____5-15______ seconds. Relax after you breathe out (exhale). Repeat _______3___ times. Complete this exercise _______2___ times a day. Strengthening exercises These exercises build strength and endurance in your back and your shoulder blade muscles. Endurance is the ability to use your muscles for a long time, even after they get tired. Alternating arm and leg raises  Get on your hands and knees on a firm surface. If you are on a hard floor, you may want to use padding, such as an exercise mat, to cushion your knees. Line up your arms and legs. Your hands should be directly below your shoulders, and your knees should be directly below your hips. Lift your left leg behind you. At the same time, raise your right arm and straighten it in front of you. Do not lift your leg higher than your hip. Do not lift your arm higher than your shoulder. Keep your abdominal and back muscles tight. Keep your hips  facing the ground. Do not arch your back. Keep your balance carefully, and do not hold your breath. Hold for ___5-15_______ seconds. Slowly return to the starting position and repeat with your right leg and your left arm. Repeat ______3____ times. Complete this exercise ______2____ times a day. Straight arm rows This exercise is also called shoulder extension exercise. Stand with your feet shoulder width apart. Secure an exercise band to a stable object in front of you so the band is at or above shoulder height. Hold one end of the exercise band in each hand. Straighten your elbows and lift your hands up to shoulder height. Step back, away from the secured end of the exercise band, until the band stretches. Squeeze your shoulder blades together and pull your hands down to the sides of your thighs. Stop when your hands are straight down by your sides. This is shoulder extension. Do not let your hands go behind your body. Hold for ____5-15______ seconds. Slowly return to the starting position. Repeat ____3______ times. Complete this exercise ______2____ times a day. Prone shoulder external rotation Lie on your abdomen on a firm bed so your left / right forearm hangs over the edge of the bed and your upper arm is on the bed, straight out from your body. This is the prone position. Your elbow should be bent. Your palm should be facing your feet. If instructed, hold a ____none______ weight in your hand. Squeeze your shoulder blade toward the middle of your back. Do not let   your shoulder lift toward your ear. Keep your elbow bent in a 90-degree angle (right angle) while you slowly move your forearm up toward the ceiling. Move your forearm up to the height of the bed, toward your head. This is external rotation. Your upper arm should not move. At the top of the movement, your palm should face the floor. Hold for ___5-15_______ seconds. Slowly return to the starting position and relax your  muscles. Repeat ____3______ times. Complete this exercise ____2______ times a day. Rowing scapular retraction This is an exercise in which the shoulder blades (scapulae) are pulled toward each other (retraction). Sit in a stable chair without armrests, or stand up. Secure an exercise band to a stable object in front of you so the band is at shoulder height. Hold one end of the exercise band in each hand. Your palms should face down. Bring your arms out straight in front of you. Step back, away from the secured end of the exercise band, until the band stretches. Pull the band backward. As you do this, bend your elbows and squeeze your shoulder blades together, but avoid letting the rest of your body move. Do not shrug your shoulders upward while you do this. Stop when your elbows are at your sides or slightly behind your body. Hold for ___5-15_______ seconds. Slowly straighten your arms to return to the starting position. Repeat _____3_____ times. Complete this exercise _____2_____ times a day. Posture and body mechanics Good posture and healthy body mechanics can help to relieve stress in your body's tissues and joints. Body mechanics refers to the movements and positions of your body while you do your daily activities. Posture is part of body mechanics. Good posture means: Your spine is in its natural S-curve position (neutral). Your shoulders are pulled back slightly. Your head is not tipped forward. Follow these guidelines to improve your posture and body mechanics in your everyday activities. Standing  When standing, keep your spine neutral and your feet about hip width apart. Keep a slight bend in your knees. Your ears, shoulders, and hips should line up with each other. When you do a task in which you lean forward while standing in one place for a long time, place one foot up on a stable object that is 2-4 inches (5-10 cm) high, such as a footstool. This helps keep your spine  neutral. Sitting  When sitting, keep your spine neutral and keep your feet flat on the floor. Use a footrest, if necessary, and keep your thighs parallel to the floor. Avoid rounding your shoulders, and avoid tilting your head forward. When working at a desk or a computer, keep your desk at a height where your hands are slightly lower than your elbows. Slide your chair under your desk so you are close enough to maintain good posture. When working at a computer, place your monitor at a height where you are looking straight ahead and you do not have to tilt your head forward or downward to look at the screen. Resting When lying down and resting, avoid positions that are most painful for you. If you have pain with activities such as sitting, bending, stooping, or squatting (flexion-basedactivities), lie in a position in which your body does not bend very much. For example, avoid curling up on your side with your arms and knees near your chest (fetal position). If you have pain with activities such as standing for a long time or reaching with your arms (extension-basedactivities), lie with your spine in   a neutral position and bend your knees slightly. Try the following positions: Lie on your side with a pillow between your knees. Lie on your back with a pillow under your knees.  Lifting  When lifting objects, keep your feet at least shoulder width apart and tighten your abdominal muscles. Bend your knees and hips and keep your spine neutral. It is important to lift using the strength of your legs, not your back. Do not lock your knees straight out. Always ask for help to lift heavy or awkward objects. This information is not intended to replace advice given to you by your health care provider. Make sure you discuss any questions you have with your health care provider. Document Revised: 08/15/2018 Document Reviewed: 06/02/2018 Else[employer] Patient Education  2023 [employer] Thoracic Strain A  thoracic strain, which is sometimes called a mid-back strain, is an injury to the muscles or tendons that attach to the upper part of your back behind your chest. This type of injury occurs when a muscle is overstretched or overloaded. Thoracic strains can range from mild to severe. Mild strains may involve stretching a muscle or tendon without tearing it. These injuries may heal in 1-2 weeks. More severe strains involve tearing of muscle fibers or tendons. These will cause more pain and may take 6-8 weeks to heal. What are the causes? This condition may be caused by: Trauma, such as a fall or a hit to the body. Twisting or overstretching the back. This may result from doing activities that require a lot of energy, such as lifting heavy objects. In some cases, the cause may not be known. What increases the risk? This injury is more common in: Athletes. People with obesity. What are the signs or symptoms? The main symptom of this condition is pain in the middle back, especially with movement. Other symptoms include: Stiffness or limited range of motion. Sudden muscle tightening (spasms). How is this diagnosed? This condition may be diagnosed based on: Your symptoms. Your medical history. A physical exam. Imaging tests, such as X-rays or an MRI. How is this treated? This condition may be treated with: Resting the injured area. Applying heat and cold to the injured area. Over-the-counter medicines for pain and inflammation, such as NSAIDs. Prescription pain medicine or muscle relaxants may be needed for a short time. Physical therapy. This will involve doing stretching and strengthening exercises. Follow these instructions at home: Managing pain, stiffness, and swelling     If directed, put ice on the injured area. Put ice in a plastic bag. Place a towel between your skin and the bag. Leave the ice on for 20 minutes, 2-3 times a day. If directed, apply heat to the affected area as  often as told by your health care provider. Use the heat source that your health care provider recommends, such as a moist heat pack or a heating pad. Place a towel between your skin and the heat source. Leave the heat on for 20-30 minutes. Remove the heat if your skin turns bright red. This is especially important if you are unable to feel pain, heat, or cold. You may have a greater risk of getting burned. Activity Rest and return to your normal activities as told by your health care provider. Ask your health care provider what activities are safe for you. Do exercises as told by your health care provider. Medicines Take over-the-counter and prescription medicines only as told by your health care provider. Ask your health care provider if the   medicine prescribed to you: Requires you to avoid driving or using heavy machinery. Can cause constipation. You may need to take these actions to prevent or treat constipation: Drink enough fluid to keep your urine pale yellow. Take over-the-counter or prescription medicines. Eat foods that are high in fiber, such as beans, whole grains, and fresh fruits and vegetables. Limit foods that are high in fat and processed sugars, such as fried or sweet foods. Injury prevention To prevent a future mid-back injury: Always warm up properly before physical activity or sports. Cool down and stretch after being active. Use correct form when playing sports and lifting heavy objects. Bend your knees before you lift heavy objects. Use good posture when sitting and standing. Stay physically fit and maintain a healthy weight. Do at least 150 minutes of moderate-intensity exercise each week, such as brisk walking or water aerobics. Do strength exercises at least 2 times each week.  General instructions Do not use any products that contain nicotine or tobacco, such as cigarettes, e-cigarettes, and chewing tobacco. If you need help quitting, ask your health care  provider. Keep all follow-up visits as told by your health care provider. This is important. Contact a health care provider if: Your pain is not helped by medicine. Your pain or stiffness is getting worse. You develop pain or stiffness in your neck or lower back. Get help right away if you: Have shortness of breath. Have chest pain. Develop numbness or weakness in your legs or arms. Have involuntary loss of urine (urinary incontinence). Summary A thoracic strain, which is sometimes called a mid-back strain, is an injury to the muscles or tendons that attach to the upper part of your back behind your chest. This type of injury occurs when a muscle is overstretched or overloaded. Rest and return to your normal activities as told by your health care provider. If directed, apply heat or ice to the affected area as often as told by your health care provider. Take over-the-counter and prescription medicines only as told by your health care provider. Contact a health care provider if you have new or worsening symptoms. This information is not intended to replace advice given to you by your health care provider. Make sure you discuss any questions you have with your health care provider. Document Revised: 02/22/2021 Document Reviewed: 02/22/2021 Elsevier Patient Education  2023 [employer]  

## 2021-11-28 NOTE — Progress Notes (Signed)
Subjective:    Patient ID: Kathleen Silva, female    DOB: 01-20-1961, 61 y.o.   MRN: [id number]  61y/o african [place] female established patient here for evaluation mid back pain started when trying to put on her bra this am.  Sharp and worsening with arm movements.  Finally was able to finish dressing/drove self to work after taking 2 arthritis strength tylenol tablets orally.  Denied falls/arm/leg weakness, saddle parethesias or loss of bowel/bladder control.  Pain improving with work/having to pick up and carry items/keep moving.  Also applied ice pack and that helped a great deal strapped to body.      Review of Systems  Constitutional:  Negative for chills and fever.  HENT:  Negative for sore throat, trouble swallowing and voice change.   Eyes:  Negative for photophobia and visual disturbance.  Respiratory:  Negative for cough, choking and shortness of breath.   Cardiovascular:  Negative for chest pain.  Gastrointestinal:  Negative for diarrhea, nausea and vomiting.  Endocrine: Negative for cold intolerance and heat intolerance.  Genitourinary:  Negative for dysuria and enuresis.  Musculoskeletal:  Positive for back pain and myalgias. Negative for gait problem, neck pain and neck stiffness.  Skin:  Negative for rash.  Allergic/Immunologic: Positive for environmental allergies. Negative for food allergies.  Neurological:  Negative for dizziness, tremors, seizures, syncope, facial asymmetry, speech difficulty, weakness, light-headedness, numbness and headaches.  Hematological:  Negative for adenopathy. Does not bruise/bleed easily.  Psychiatric/Behavioral:  Negative for agitation, confusion and sleep disturbance.        Objective:   Physical Exam Vitals and nursing note reviewed.  Constitutional:      General: She is awake. She is not in acute distress.    Appearance: Normal appearance. She is well-developed and well-groomed. She is not ill-appearing, toxic-appearing or  diaphoretic.  HENT:     Head: Normocephalic and atraumatic.     Jaw: There is normal jaw occlusion.     Salivary Glands: Right salivary gland is not diffusely enlarged. Left salivary gland is not diffusely enlarged.     Right Ear: Hearing and external ear normal.     Left Ear: Hearing and external ear normal.     Nose: Nose normal. No congestion or rhinorrhea.     Mouth/Throat:     Lips: Pink. No lesions.     Mouth: Mucous membranes are moist.     Pharynx: Oropharynx is clear.  Eyes:     General: Lids are normal. Vision grossly intact. Gaze aligned appropriately. No scleral icterus.       Right eye: No discharge.        Left eye: No discharge.     Extraocular Movements: Extraocular movements intact.     Conjunctiva/sclera: Conjunctivae normal.     Pupils: Pupils are equal, round, and reactive to light.  Neck:     Trachea: Trachea normal.  Cardiovascular:     Rate and Rhythm: Normal rate and regular rhythm.     Pulses: Normal pulses.  Pulmonary:     Effort: Pulmonary effort is normal. No respiratory distress.     Breath sounds: Normal breath sounds and air entry. No stridor or transmitted upper airway sounds. No wheezing, rhonchi or rales.     Comments: Spoke full sentences without difficulty; no cough observed in exam room Abdominal:     General: Abdomen is flat.  Musculoskeletal:        General: Tenderness present. No swelling or deformity. Normal range of motion.  Right shoulder: No swelling, deformity, effusion, laceration, tenderness, bony tenderness or crepitus. Normal range of motion. Normal strength.     Left shoulder: No swelling, deformity, effusion, laceration, tenderness, bony tenderness or crepitus. Normal range of motion. Normal strength.     Right hand: No swelling, deformity or lacerations. Normal range of motion. Normal strength. Normal capillary refill.     Left hand: No swelling, deformity or lacerations. Normal range of motion. Normal strength. Normal  capillary refill.     Cervical back: Normal range of motion and neck supple. No swelling, edema, deformity, erythema, signs of trauma, lacerations, rigidity, spasms, tenderness or crepitus. No pain with movement. Normal range of motion.     Thoracic back: Spasms and tenderness present. No swelling, edema, deformity, signs of trauma, lacerations or bony tenderness. Normal range of motion.     Lumbar back: No swelling, edema, deformity, signs of trauma or lacerations. Normal range of motion.       Back:     Comments: Right paraspinals T2-T6 right TTP; flexion/extension/lateral bending and rotation without difficulty patient reports some pain with AROM but not as severe as this morning  Lymphadenopathy:     Head:     Right side of head: No submandibular or preauricular adenopathy.     Left side of head: No submandibular or preauricular adenopathy.     Cervical:     Right cervical: No superficial cervical adenopathy.    Left cervical: No superficial cervical adenopathy.  Skin:    General: Skin is warm and dry.     Capillary Refill: Capillary refill takes less than 2 seconds.     Coloration: Skin is not ashen, cyanotic, jaundiced, mottled, pale or sallow.     Findings: No abrasion, abscess, acne, bruising, burn, ecchymosis, erythema, signs of injury, laceration, lesion, petechiae, rash or wound.     Nails: There is no clubbing.  Neurological:     General: No focal deficit present.     Mental Status: She is alert and oriented to person, place, and time. Mental status is at baseline.     GCS: GCS eye subscore is 4. GCS verbal subscore is 5. GCS motor subscore is 6.     Cranial Nerves: Cranial nerves 2-12 are intact. No cranial nerve deficit, dysarthria or facial asymmetry.     Sensory: Sensation is intact.     Motor: Motor function is intact. No weakness, tremor, atrophy, abnormal muscle tone or seizure activity.     Coordination: Coordination is intact. Coordination normal.     Gait: Gait is  intact. Gait normal.     Comments: In/out of chair without difficulty; gait sure and steady in clinic; bilateral hand grasp equal 5/5  Psychiatric:        Attention and Perception: Attention and perception normal.        Mood and Affect: Mood and affect normal.        Speech: Speech normal.        Behavior: Behavior normal. Behavior is cooperative.        Thought Content: Thought content normal.        Cognition and Memory: Cognition and memory normal.        Judgment: Judgment normal.           Assessment & Plan:   A-acute right sided thoracic pain initial visit  P-most likely strain.  If needs restrictions will need to see PCM as I am unable to write work restrictions in this clinic due to  contract limitations.   Tylenol 1000mg  po q6h prn pain discussed max dosing at one time is 1 tylenol arthritis pill (650mg ).  Patient given tylenol 500mg  UD 8 packages from clinic stock today.  Refused biofreeze as unable to apply by herself to affected area unable to reach.  Thermacare patches x 2 given to patient for use today one today and one tomorrow.  May continue ice 15 minutes QID prn pain/swelling.  Consider epsom salt soak bath tonight.  If worsening consider Rx cyclobenazeprine/flexeril 10mg  sig 1/2-1 po TID prn muscle spasms #30 RF0  Home stretches demonstrated to patient-e.g. eagle arms, Arm circles, walking up wall, chest stretches, neck AROM, chin tucks, knee to chest and rock side to side on back. Self massage or professional prn, foam roller use or tennis/racquetball.  Heat/cryotherapy 15 minutes QID prn.  Trial thermacare 1 applied and another given to patient for use tomorrow from clinic stock.  Consider physical therapy referral if no improvement with prescribed therapy from [facility] and/or chiropractic care.  Ensure ergonomics correct desk at work avoid repetitive motions if possible/holding phone/laptop in hand use desk/stand and/or break up lifting items into smaller loads/weights.  Patient  was instructed to rest, ice, and ROM exercises.  Activity as tolerated.   Follow up if symptoms persist or worsen especially if loss of bowel/bladder control, arm/leg weakness and/or saddle paresthesias.  Exitcare handout on thoracic strain and rehab exercises.  Patient verbalized agreement and understanding of treatment plan and had no further questions at this time.  P2:  Injury Prevention and Fitness.

## 2022-04-28 ENCOUNTER — Telehealth: Payer: Self-pay | Admitting: Registered Nurse

## 2022-04-28 ENCOUNTER — Encounter: Payer: Self-pay | Admitting: Registered Nurse

## 2022-04-28 DIAGNOSIS — M79671 Pain in right foot: Secondary | ICD-10-CM

## 2022-04-28 NOTE — Telephone Encounter (Signed)
Patient with heel pain this week.  Has tried heel cups, stretching, ice.  Wondering if she can restarted her diclofenac tablets she has at home and if they would be helpful for heel pain.  She reported last flare up she required injection from podiatry other foot.  Discussed heel spurs most likely irritating plantar fascia when walking barefoot at home or new shoes with less cushion (wearing ankle boots this week).  Discussed elevated heel, foam footbed to cushion foot bones. Diclofenac 75mg  po BID prn pain take at least once a day for two weeks.  [facility]care handout on plantar fasciitis and plantar fasciitis rehab exercises sent to my chart.   Discussed achilles/gastrocnemius/foot/plantar stretches and icing 15 minutes at least nightly with frozen water bottle rolling foot over.. Follow up with PCM if no improvement with discussed care for podiatry referral. Consider new supportive footwear/OTC inserts if shoe treads worn out/greater than 61 year old.  Do not walk barefoot at home or thin leather no support sandals/flip flops/shoes as this can worsen condition/pain.  Consider compression socks/plantar fasciitis socks and weight loss also. Patient verbalized understanding of instructions, agreed with plan of care and had no further questions at this time. 2

## 2022-05-17 ENCOUNTER — Telehealth: Payer: Self-pay | Admitting: Registered Nurse

## 2022-05-17 ENCOUNTER — Encounter: Payer: Self-pay | Admitting: Registered Nurse

## 2022-05-17 DIAGNOSIS — U071 COVID-19: Secondary | ICD-10-CM

## 2022-05-17 MED ORDER — NIRMATRELVIR/RITONAVIR (PAXLOVID)TABLET: 3.0000 | ORAL_TABLET | Freq: Two times a day (BID) | ORAL | 0 refills | Status: AC

## 2022-05-17 MED ORDER — SALINE SPRAY 0.65 % NA SOLN: 2.0000 | NASAL | 0 refills | Status: DC

## 2022-05-17 MED ORDER — FLUTICASONE PROPIONATE 50 MCG/ACT NA SUSP: 1.0000 | Freq: Two times a day (BID) | NASAL | 1 refills | Status: AC
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
## 2022-05-17 NOTE — Telephone Encounter (Signed)
Patient called in reports test positive for COVID today. Asked her to email me a picture of the test. Reports been out since Tuesday wasn't feeling good. Thought it was head cold. Today reports congestion cough and low grade fever. States uses [facility] in [place]. Educated on [employer] and notify HR, Public relations account executive. Otila Kluver Np to call follow up with today.

## 2022-05-21 NOTE — Telephone Encounter (Signed)
Got email from patient reports feeling better slight cough no fever since Saturday. Attempted to call no answer. Sent a email. Phone follow up call prior to coming back to work. Patient Fever free. No NVD. Pt feeling much better. Educated if any further issues to come to the clinic.

## 2022-05-22 NOTE — Telephone Encounter (Signed)
Patient left message "tomorrow is supposed to be my first day back to work, I'm feeling much better, I still have a slight cough, but have not ran a fever since Saturday.. I still have a full day of the antiviral medication to take..when do I retest, or do I have too."  Message left for patient "Kathleen Silva,  You do not need to retest for work.  If you live with others I recommend retesting before you keep mask off with loved ones.  If positive and still having symptoms typically individuals still test positive if having symptoms watch how many minutes it takes for your test to turn positive.  If less than half way through waiting period I would hold off retesting for another 3-4 days.  If during the last minute of wait period wait 48 hours and retest.  Glad you are feeling better.  You may notice you get fatigued more easily.  You may need a nap after work this week.  Try to stay hydrated at work with straw under mask and avoid skipping meals.  Reminder if bad weather see HR and they will point you to a conference room that is available to eat inside.  Do not use employee lunch room until after your day 10 of mask wear.  If worsening symptoms this week please come see [place] in clinic"

## 2022-05-22 NOTE — Telephone Encounter (Signed)
Patient seen in workcenter today wearing mask feeling well skin warm dry and pink.  No audible nasal congestion, throat clearing or cough observed.  Sp02 100% RA HR 72 respirations even and unlabored RA BBS CTA.  Patient also asked how long paxlovid remains in the body epocrates reviewed and half life 6 hours discussed with patient every 6 hours half of the drug removed from her body by liver and kidneys.  Patient A&Ox3 and had no further questions or concerns.  Tolerating po intake without difficulty.  Continuing mask wear when around others through Day 10 and no eating in employee lunch room.

## 2022-05-30 NOTE — Telephone Encounter (Signed)
Patient seen in workcenter stated recovered from covid denied symptoms lingering.  Feet pain continues bought OTC new orthotics/heel cups and getting used to them in her shoes as only has been using 2 days.  Started with only using in one shoe then bought for the other.  Still stretching/using ice "frozen water bottle" until it is melted in the evenings.  It does help.  Patient plans to schedule appt with podiatry if no improvement as has required injections bilateral feet in the past.  Discussed with patient to monitor ergonomics and that she is standing with weight evenly distributed to both feet and not just putting most of weight on one leg/foot.  She did recognize due to pain she had been putting most of body weight on one foot when standing then that flares up  the other foot.  Had been wearing compression stocking only on worst pain foot recently started wearing on both feet at work also.  Discussed with patient to ensure both shoes have orthotics and wearing compression at work.  She has plantar fasciitis socks.  Dr Felicie Morn orthotics for plantar fasciitis and heel spurs were purchased by patient.  Gait sure and steady.  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  A&Ox3 spoke full sentences without difficulty respirations even and unlabored RA no nasal congestion/throat clearing or cough observed during 5 minute discussion.  Patient agreed with plan of care and had no further questions at this time.

## 2022-06-18 ENCOUNTER — Telehealth: Payer: Self-pay | Admitting: Occupational Medicine

## 2022-06-18 NOTE — Telephone Encounter (Signed)
Patient asking when should get COVID booster since has antibodies last Covid was Jan. Recommend to get booster 60 days or 2 months from [place] in Jan.

## 2022-07-18 ENCOUNTER — Telehealth: Payer: Self-pay | Admitting: Registered Nurse

## 2022-07-18 ENCOUNTER — Encounter: Payer: Self-pay | Admitting: Registered Nurse

## 2022-07-18 DIAGNOSIS — M79671 Pain in right foot: Secondary | ICD-10-CM

## 2022-07-18 NOTE — Telephone Encounter (Signed)
Patient reported wearing orthotics OTC and sneakers has helped a greater deal.  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  Pain typically resolves on weekends when she is at home.  Has noticed anti fatigue mat not wide enough and beveled edge positioning foot/ankle in awkward position discussed with [employer] office to get wide anti-fatigue standing mats for her work area.  Denied worsening or new symptoms or needs at this time.

## 2022-08-02 ENCOUNTER — Encounter: Payer: Self-pay | Admitting: Registered Nurse

## 2022-08-02 ENCOUNTER — Telehealth: Payer: Self-pay | Admitting: Registered Nurse

## 2022-08-02 DIAGNOSIS — Z0000 Encounter for general adult medical examination without abnormal findings: Secondary | ICD-10-CM

## 2022-08-02 NOTE — Telephone Encounter (Signed)
Patient reported new standing mats at workcenter and wearing sneakers with inserts and pain has flared down now.  Denied further questions or concerns.

## 2022-08-02 NOTE — Telephone Encounter (Signed)
Epic reviewed last labs Jun 2023.  Vitamin D low normal.  Female executive panel, Hgba1c and vitamin D ordered for patient.  RN Kimrey notified to schedule appt with patient.

## 2022-08-30 ENCOUNTER — Other Ambulatory Visit: Payer: Self-pay | Admitting: Occupational Medicine

## 2022-08-30 DIAGNOSIS — Z0000 Encounter for general adult medical examination without abnormal findings: Secondary | ICD-10-CM

## 2022-08-30 NOTE — Progress Notes (Signed)
Lab drawn tolerated well no issues noted.   

## 2022-08-31 LAB — CMP12+LP+TP+TSH+6AC+CBC/D/PLT
ALT: 12 IU/L (ref 0–32)
AST: 18 IU/L (ref 0–40)
Albumin/Globulin Ratio: 1.6 (ref 1.2–2.2)
Albumin: 4.3 g/dL (ref 3.9–4.9)
Alkaline Phosphatase: 101 IU/L (ref 44–121)
BUN/Creatinine Ratio: 18 (ref 12–28)
BUN: 15 mg/dL (ref 8–27)
Basophils Absolute: 0 10*3/uL (ref 0.0–0.2)
Basos: 0 %
Bilirubin Total: 0.7 mg/dL (ref 0.0–1.2)
Calcium: 9.4 mg/dL (ref 8.7–10.3)
Chloride: 105 mmol/L (ref 96–106)
Chol/HDL Ratio: 3.1 ratio (ref 0.0–4.4)
Cholesterol, Total: 220 mg/dL — ABNORMAL HIGH (ref 100–199)
Creatinine, Ser: 0.83 mg/dL (ref 0.57–1.00)
EOS (ABSOLUTE): 0.1 10*3/uL (ref 0.0–0.4)
Eos: 3 %
Estimated CHD Risk: <0.5 times avg. (ref 0.0–1.0)
Free Thyroxine Index: 1.9 (ref 1.2–4.9)
GGT: 11 IU/L (ref 0–60)
Globulin, Total: 2.7 g/dL (ref 1.5–4.5)
Glucose: 94 mg/dL (ref 70–99)
HDL: 71 mg/dL (ref >39)
Hematocrit: 40.5 % (ref 34.0–46.6)
Hemoglobin: 13.3 g/dL (ref 11.1–15.9)
Immature Grans (Abs): 0 10*3/uL (ref 0.0–0.1)
Immature Granulocytes: 0 %
Iron: 96 ug/dL (ref 27–139)
LDH: 185 IU/L (ref 119–226)
LDL Chol Calc (NIH): 139 mg/dL — ABNORMAL HIGH (ref 0–99)
Lymphocytes Absolute: 2.2 10*3/uL (ref 0.7–3.1)
Lymphs: 42 %
MCH: 31.1 pg (ref 26.6–33.0)
MCHC: 32.8 g/dL (ref 31.5–35.7)
MCV: 95 fL (ref 79–97)
Monocytes Absolute: 0.5 10*3/uL (ref 0.1–0.9)
Monocytes: 9 %
Neutrophils Absolute: 2.4 10*3/uL (ref 1.4–7.0)
Neutrophils: 46 %
Phosphorus: 2.9 mg/dL — ABNORMAL LOW (ref 3.0–4.3)
Platelets: 211 10*3/uL (ref 150–450)
Potassium: 4.4 mmol/L (ref 3.5–5.2)
RBC: 4.28 x10E6/uL (ref 3.77–5.28)
RDW: 12.5 % (ref 11.7–15.4)
Sodium: 142 mmol/L (ref 134–144)
T3 Uptake Ratio: 26 % (ref 24–39)
T4, Total: 7.4 ug/dL (ref 4.5–12.0)
TSH: 1.74 u[IU]/mL (ref 0.450–4.500)
Total Protein: 7 g/dL (ref 6.0–8.5)
Triglycerides: 59 mg/dL (ref 0–149)
Uric Acid: 6.6 mg/dL (ref 3.0–7.2)
VLDL Cholesterol Cal: 10 mg/dL (ref 5–40)
WBC: 5.3 10*3/uL (ref 3.4–10.8)
eGFR: 80 mL/min/{1.73_m2} (ref >59)

## 2022-08-31 LAB — VITAMIN D 25 HYDROXY (VIT D DEFICIENCY, FRACTURES): Vit D, 25-Hydroxy: 32 ng/mL (ref 30.0–100.0)

## 2022-08-31 LAB — HEMOGLOBIN A1C
Est. average glucose Bld gHb Est-mCnc: 105 mg/dL
Hgb A1c MFr Bld: 5.3 % (ref 4.8–5.6)

## 2022-09-01 ENCOUNTER — Encounter: Payer: Self-pay | Admitting: Registered Nurse

## 2022-09-01 DIAGNOSIS — R030 Elevated blood-pressure reading, without diagnosis of hypertension: Secondary | ICD-10-CM

## 2022-09-11 ENCOUNTER — Ambulatory Visit: Payer: Self-pay | Admitting: Occupational Medicine

## 2022-09-11 VITALS — BP 160/90 | HR 88 | Resp 18 | Ht 68.5 in | Wt 276.2 lb

## 2022-09-11 DIAGNOSIS — R030 Elevated blood-pressure reading, without diagnosis of hypertension: Secondary | ICD-10-CM

## 2022-09-11 DIAGNOSIS — Z0000 Encounter for general adult medical examination without abnormal findings: Secondary | ICD-10-CM

## 2022-09-11 NOTE — Progress Notes (Signed)
Be well insurance premium discount evaluation: Not met will re check BP one more time before alternative started.   Epic reviewed by RN Bess Kinds and transcribed labs.  Tobacco attestation signed. [facility] ROI formed signed. Forms placed in the chart.   Patient given handouts for [facility] pharmacies and discount drugs list,MyChart, Tele doc setup, Tele doc [address], [place] counseling and [employer] counseling.  What to do for infectious illness protocol. Given handout for list of medications that can be filled at [facility]. Given Clinic hours and Clinic Email.

## 2022-09-19 ENCOUNTER — Ambulatory Visit: Payer: Self-pay | Admitting: Occupational Medicine

## 2022-09-19 VITALS — BP 150/95

## 2022-09-19 DIAGNOSIS — R030 Elevated blood-pressure reading, without diagnosis of hypertension: Secondary | ICD-10-CM

## 2022-09-19 NOTE — Progress Notes (Signed)
Pt BP still elevated. Asked her to bring in BP machine for me to verify. Patient given BP log to document readings. Will confirm BP. Also, will follow up with NP if needing BP if BP not going down.

## 2022-09-20 ENCOUNTER — Ambulatory Visit: Payer: Self-pay | Admitting: Occupational Medicine

## 2022-09-20 VITALS — BP 130/88

## 2022-09-20 DIAGNOSIS — R030 Elevated blood-pressure reading, without diagnosis of hypertension: Secondary | ICD-10-CM

## 2022-09-20 NOTE — Telephone Encounter (Unsigned)
Patient seen in clinic 09/20/22 and requested further actions she can take for cholesterol and elevated blood pressure.  Has decreased salt intake and systolic blood pressure has been decreasing.  Brought home BP monitor to clinic for evaluation diastolic reading 20 points lower than clinic machines or manual cuff.  Systolic reading within 5 points.  Patient previously had taken garliq to help with her cholesterol but it made her urinate frequently so she stopped.  Reviewed ingredients in supplement garlic powder (500mcg allicin), magnesium stearate, magnesium silicate, mineral oil, polyethylene glycol.  Contains NO sugar, starch, yeast, caffeine, dairy or preservatives per manufacturer packaging.  Discussed with patient magnesium level to be checked if she is taking it daily long term ordered for 3 months.  Discussed studies have shown improved cholesterol and blood pressure. Per NIH studies total cholesterol lowering up to 23 mg/dL and LDL 15 mg/dL.   Blood pressure lowering by 12 mmHg over 12 weeks seen at 200mg  dosing.  Recommended to stop prior to any surgeries as can increase bleeding.  Notify provider if bruising/nosebleeds/blood in urine/stool.  If eating raw garlic 1 clove per day maximum recommended.  Patient also going to start taking chia and flax seeds.  Discussed goal chia seeds 3 tablespoons per day start with 1 tablespoon allow to hydrate in liquid for a couple hours prior to eating (chia pudding).  Discussed increase by 1 tablespoon each week until at recommended 3 tablespoons per day dose.  Discussed increased fiber too quickly in diet can lead to bloating and gas  Patient stated she still plans to take her benefiber supplement.  Discussed with patient if stools getting loose with chia seeds/flax seeds and benefiber would stop benefiber first.  Patient agreed to blood pressure follow up and continue log as did not meet Be Well insurance discount requirements at this time with labs and BP check  starting alternative.  Patient wants to try natural options before starting blood pressure medication.  Discussed if systolic consistently over 140 and diastolic greater than 85 I do recommend starting medication if unable to lower through weight loss, dash diet.  Patient verbalized understanding information/instructions, agreed with plan of care and had no further questions at this time.

## 2022-09-20 NOTE — Progress Notes (Signed)
Noted a 20 point difference in her BP machine. Patient going to start garliqu supplement, working on sodium intake, and going to try [employer]. Follow up in 2 weeks to re check BP.

## 2022-09-29 ENCOUNTER — Encounter: Payer: Self-pay | Admitting: Registered Nurse

## 2022-09-29 ENCOUNTER — Telehealth: Payer: Self-pay | Admitting: Registered Nurse

## 2022-09-29 DIAGNOSIS — E785 Hyperlipidemia, unspecified: Secondary | ICD-10-CM

## 2022-09-29 DIAGNOSIS — R030 Elevated blood-pressure reading, without diagnosis of hypertension: Secondary | ICD-10-CM

## 2022-09-30 DIAGNOSIS — E785 Hyperlipidemia, unspecified: Secondary | ICD-10-CM | POA: Insufficient documentation

## 2022-09-30 NOTE — Telephone Encounter (Signed)
Labs completed 08/30/22 see results note in epic 

## 2022-09-30 NOTE — Telephone Encounter (Signed)
Patient asked if swanson chia 400mg  capsules take 2 daily a good substitute for whole chia seeds.  Discussed 30grams or 30000mg  in 3 tablespoons whole seeds.  Her capsules have magnesium in them also but label does not say how much along with rice flour and to equal same dose would need to take 75 capsules per day which I do not recommend.  For cholesterol/blood pressure benefits I recommend starting 1 tablespoon chia or flax seeds per day and increase 1 tablespoon per week until at 3 tablespoons per day to help prevent GI upset due to increasing fiber in diet and drink water to keep urine pale yellow and clear and voiding every 2-4 hours while awake.   Other option to eat 4 [place] nuts per month if she does not feel she can add chia seeds to diet or she can do both. Patient verbalized understanding information and had no further questions at that time.

## 2022-10-09 ENCOUNTER — Ambulatory Visit: Payer: Self-pay | Admitting: Occupational Medicine

## 2022-10-09 VITALS — BP 140/84 | HR 100

## 2022-10-09 DIAGNOSIS — R030 Elevated blood-pressure reading, without diagnosis of hypertension: Secondary | ICD-10-CM

## 2022-10-09 NOTE — Progress Notes (Signed)
Patient is cutting down on salt and started chia seeds for 1-2 weeks at one table spoons. Asked to increase to 2 tablespoons. Re check in 2 weeks.

## 2022-10-21 NOTE — Progress Notes (Signed)
Noted patient has had follow up labs 

## 2022-10-23 ENCOUNTER — Ambulatory Visit: Payer: Self-pay | Admitting: Occupational Medicine

## 2022-10-25 ENCOUNTER — Ambulatory Visit: Payer: Self-pay | Admitting: Occupational Medicine

## 2022-10-25 VITALS — BP 160/90

## 2022-10-25 DIAGNOSIS — Z0130 Encounter for examination of blood pressure without abnormal findings: Secondary | ICD-10-CM

## 2022-10-25 DIAGNOSIS — R030 Elevated blood-pressure reading, without diagnosis of hypertension: Secondary | ICD-10-CM

## 2022-10-25 NOTE — Progress Notes (Signed)
Chia seeds 2 tablespoons. Noted stressed about BP. Talked about positive thinking and distreaction with breathing technique

## 2022-10-31 ENCOUNTER — Ambulatory Visit: Payer: Self-pay | Admitting: Occupational Medicine

## 2022-11-01 ENCOUNTER — Ambulatory Visit: Payer: Self-pay | Admitting: Occupational Medicine

## 2022-11-01 VITALS — BP 140/90

## 2022-11-01 DIAGNOSIS — Z0130 Encounter for examination of blood pressure without abnormal findings: Secondary | ICD-10-CM

## 2022-11-01 DIAGNOSIS — R030 Elevated blood-pressure reading, without diagnosis of hypertension: Secondary | ICD-10-CM

## 2022-11-01 NOTE — Progress Notes (Signed)
Patients blood pressure is improving with chia less salt. Patient reports may try garlic. Follow up next week.

## 2022-11-07 ENCOUNTER — Ambulatory Visit: Payer: Self-pay | Admitting: Occupational Medicine

## 2022-11-07 VITALS — BP 140/88

## 2022-11-07 DIAGNOSIS — Z0130 Encounter for examination of blood pressure without abnormal findings: Secondary | ICD-10-CM

## 2022-11-07 NOTE — Progress Notes (Signed)
Patient continues to watch salt caffeine and doing chia seeds. Going to start Garlic later this week.

## 2022-11-13 ENCOUNTER — Ambulatory Visit: Payer: Self-pay | Admitting: Occupational Medicine

## 2022-11-13 VITALS — BP 140/88

## 2022-11-13 DIAGNOSIS — Z0130 Encounter for examination of blood pressure without abnormal findings: Secondary | ICD-10-CM

## 2022-11-13 DIAGNOSIS — R030 Elevated blood-pressure reading, without diagnosis of hypertension: Secondary | ICD-10-CM

## 2022-11-13 NOTE — Progress Notes (Signed)
Started garlic last week taking every other day. Follow up next week.

## 2022-11-14 ENCOUNTER — Ambulatory Visit: Payer: Self-pay | Admitting: Occupational Medicine

## 2022-11-21 ENCOUNTER — Ambulatory Visit: Payer: Self-pay | Admitting: Occupational Medicine

## 2022-11-21 VITALS — BP 140/90

## 2022-11-21 DIAGNOSIS — Z0130 Encounter for examination of blood pressure without abnormal findings: Secondary | ICD-10-CM

## 2022-11-21 DIAGNOSIS — R030 Elevated blood-pressure reading, without diagnosis of hypertension: Secondary | ICD-10-CM

## 2022-11-21 NOTE — Progress Notes (Signed)
BP holding at this time. Educated to focus on food labels and keep salt intake to 1500 mg. Plans to increase garlic to daily instead of every other day. Fu next week.

## 2022-11-28 ENCOUNTER — Ambulatory Visit: Payer: Self-pay

## 2022-11-29 NOTE — Telephone Encounter (Signed)
Patient saw RN Burna Mortimer yesterday in clinic for BP check still elevated but improving with exercise and dietary changes did not meet Be Well 2025 requirements patient completed alternative requirements

## 2022-11-30 NOTE — Telephone Encounter (Signed)
Patient seen in workcenter to sign alternative paperwork.  Had completed BP checks with RN Kathleen Silva/Kathleen Silva still elevated above goal.  Only restarted garlique in the past week and trying to cut back further on sodium but has been eating out more.  She has been eating healthy choice meals typically sodium 400mg  but she noticed a meal last week had 1500mg  sodium in the serving.  "I think I am very salt sensitive"  Discussed Ms Kathleen Silva sodium free as alternative to flavor her foods or no sodium salt.  See RN Kathleen Silva next week for BP check Tuesday and if still elevated will start amlodipine 5mg  po daily.  Patient denied leg swelling at end of day.  No pedal or lower extremity swelling today.  Discussed amlodipine works by relaxing blood vessels to lower BP.  Possible side effects ankle swelling, dizziness if BP dropped too low.  Discussed starting with low dose to hopefully minimize either of these side effects.  Discussed medication available for free at [facility].  Discussed patient met requirements for Be Well 2025 discount by completing alternatives.  Continue limiting added salts in diet greater than 2000mg  per 24 hours.  Continue chia/flax seeds.  May continue garlique or stop.  Continue hydration and activity weekly 150 minutes.  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  Patient agreed with plan of care and had no further questions at this time.  RN Kathleen Silva to notify HR patient met insurance discount requirements for 2025 Be Well with alternative completion.

## 2022-12-03 ENCOUNTER — Ambulatory Visit: Payer: Self-pay | Admitting: Occupational Medicine

## 2022-12-03 NOTE — Telephone Encounter (Signed)
Noted appt scheduled 12/06/22 with NP and RN BP recheck 01/05/23

## 2022-12-04 ENCOUNTER — Ambulatory Visit: Payer: Self-pay | Admitting: Registered Nurse

## 2022-12-04 ENCOUNTER — Ambulatory Visit: Payer: Self-pay | Admitting: Occupational Medicine

## 2022-12-04 VITALS — BP 134/82 | HR 94 | Resp 18

## 2022-12-04 DIAGNOSIS — Z0130 Encounter for examination of blood pressure without abnormal findings: Secondary | ICD-10-CM

## 2022-12-04 DIAGNOSIS — R030 Elevated blood-pressure reading, without diagnosis of hypertension: Secondary | ICD-10-CM

## 2022-12-04 NOTE — Progress Notes (Signed)
Patient reports chia seeds daily 2 table spoons, switched to health choice frozen dinner, monitoring salt intake, garlic daily. Doing activity in outside in the yard. Will follow up with in 2 weeks to check BP.

## 2022-12-05 ENCOUNTER — Ambulatory Visit: Payer: Self-pay | Admitting: Occupational Medicine

## 2022-12-06 ENCOUNTER — Ambulatory Visit: Payer: Self-pay | Admitting: Registered Nurse

## 2022-12-06 NOTE — Progress Notes (Signed)
Patient BP 134/82 12/04/22 with RN Bess Kinds met requirements for Be Well insurance discount.  Patient having repeat BP in 2 weeks with RN Bess Kinds to ensure sustaining at goal otherwise will start low dose amlodipine 5mg  po daily.  Patient agreed with plan of care and had no further questions at that time.

## 2022-12-19 ENCOUNTER — Ambulatory Visit: Payer: Self-pay

## 2022-12-29 NOTE — Telephone Encounter (Signed)
Patient cancelled 12/06/22 OV and 01/05/23 RN BP check visit.  RN Katrinka Blazing notified to follow up with patient for BP recheck this week.

## 2023-01-02 ENCOUNTER — Ambulatory Visit: Payer: Self-pay

## 2023-01-02 NOTE — Telephone Encounter (Signed)
Spoke with patient in workcenter too busy to leave workcenter today but will follow up with RN Burna Mortimer tomorrow for BP and weight recheck.

## 2023-01-02 NOTE — Progress Notes (Deleted)
Bayle by the clinic this morning as requested for a repeat blood pressure check.  BP now is 140/82, pulse 76, and pO2 is 100%.  Advised to go in MyChart today and read the note from [facility] after her last labs were done since she has not read that note yet.  Also advised to check her blood pressure on a more regular basis here in the clinic.  She agreed. Reece Packer, RN, BSN, MPH, CO[facility]-s

## 2023-01-10 ENCOUNTER — Encounter: Payer: Self-pay | Admitting: Registered Nurse

## 2023-01-10 ENCOUNTER — Ambulatory Visit: Payer: Self-pay | Admitting: Registered Nurse

## 2023-01-10 VITALS — BP 140/90 | HR 102 | Resp 16 | Ht 68.0 in | Wt 270.0 lb

## 2023-01-10 DIAGNOSIS — Z6841 Body Mass Index (BMI) 40.0 and over, adult: Secondary | ICD-10-CM

## 2023-01-10 DIAGNOSIS — R030 Elevated blood-pressure reading, without diagnosis of hypertension: Secondary | ICD-10-CM

## 2023-01-10 NOTE — Progress Notes (Signed)
Subjective:    Patient ID: Kathleen Silva, female    DOB: 1960/05/10, 62 y.o.   MRN: [id number]  62y/o african [place] female here for BP and weight recheck.  Has been using chia seeds and garlique every day.  Has been having less hunger cravings since starting chia seeds.  Patient reported salty food and caffeine intake today but my clothes fitting looser I know I am losing weight since making dietary changes.  Splurged a little this holiday weekend on higher calorie and salty foods.  Denied headache/chest pain/visual changes or leg swelling.      Review of Systems  Constitutional:  Negative for chills and fever.  HENT:  Negative for trouble swallowing and voice change.   Eyes:  Negative for photophobia and visual disturbance.  Respiratory:  Negative for cough, choking, chest tightness, shortness of breath, wheezing and stridor.   Cardiovascular:  Negative for chest pain, palpitations and leg swelling.  Gastrointestinal:  Negative for diarrhea, nausea and vomiting.  Genitourinary:  Negative for difficulty urinating.  Musculoskeletal:  Negative for gait problem, neck pain and neck stiffness.  Skin:  Negative for rash and wound.  Neurological:  Negative for dizziness, tremors, seizures, syncope, facial asymmetry, speech difficulty, weakness, light-headedness, numbness and headaches.  Psychiatric/Behavioral:  Negative for agitation, decreased concentration and sleep disturbance.        Objective:   Physical Exam Vitals and nursing note reviewed.  Constitutional:      General: She is awake. She is not in acute distress.    Appearance: Normal appearance. She is well-developed and well-groomed. She is obese. She is not ill-appearing, toxic-appearing or diaphoretic.  HENT:     Head: Normocephalic and atraumatic.     Jaw: There is normal jaw occlusion.     Salivary Glands: Right salivary gland is not diffusely enlarged. Left salivary gland is not diffusely enlarged.     Right Ear:  Hearing and external ear normal.     Left Ear: Hearing and external ear normal.     Nose: Nose normal. No congestion or rhinorrhea.     Mouth/Throat:     Lips: Pink. No lesions.     Mouth: Mucous membranes are moist.     Pharynx: Oropharynx is clear.  Eyes:     General: Lids are normal. Vision grossly intact. Gaze aligned appropriately. No scleral icterus.       Right eye: No discharge.        Left eye: No discharge.     Extraocular Movements: Extraocular movements intact.     Conjunctiva/sclera: Conjunctivae normal.     Pupils: Pupils are equal, round, and reactive to light.  Neck:     Trachea: Trachea normal.  Cardiovascular:     Rate and Rhythm: Normal rate and regular rhythm.     Pulses: Normal pulses.          Radial pulses are 2+ on the right side and 2+ on the left side.  Pulmonary:     Effort: Pulmonary effort is normal. No respiratory distress.     Breath sounds: Normal breath sounds and air entry. No stridor or transmitted upper airway sounds. No wheezing, rhonchi or rales.     Comments: Spoke full sentences without difficulty; no cough observed in exam room Abdominal:     Palpations: Abdomen is soft.  Musculoskeletal:        General: Normal range of motion.     Cervical back: Normal range of motion and neck supple. No rigidity.  Right lower leg: No edema.     Left lower leg: No edema.  Lymphadenopathy:     Head:     Right side of head: No submandibular or preauricular adenopathy.     Left side of head: No submandibular or preauricular adenopathy.     Cervical: No cervical adenopathy.     Right cervical: No superficial cervical adenopathy.    Left cervical: No superficial cervical adenopathy.  Skin:    General: Skin is warm and dry.     Capillary Refill: Capillary refill takes less than 2 seconds.     Coloration: Skin is not ashen, cyanotic, jaundiced, mottled, pale or sallow.     Findings: No abrasion, bruising, burn, erythema, signs of injury, laceration,  lesion, petechiae, rash or wound.  Neurological:     General: No focal deficit present.     Mental Status: She is alert and oriented to person, place, and time. Mental status is at baseline.     GCS: GCS eye subscore is 4. GCS verbal subscore is 5. GCS motor subscore is 6.     Cranial Nerves: Cranial nerves 2-12 are intact. No cranial nerve deficit, dysarthria or facial asymmetry.     Motor: Motor function is intact. No weakness, tremor, atrophy, abnormal muscle tone or seizure activity.     Coordination: Coordination is intact. Coordination normal.     Gait: Gait is intact. Gait normal.     Comments: In/out of chair without difficulty; gait sure and steady in clinic; bilateral hand grasp equal 5/5  Psychiatric:        Attention and Perception: Attention and perception normal.        Mood and Affect: Mood and affect normal.        Speech: Speech normal.        Behavior: Behavior normal. Behavior is cooperative.        Thought Content: Thought content normal.        Cognition and Memory: Cognition and memory normal.        Judgment: Judgment normal.           Assessment & Plan:  A-elevated blood pressure reading without hypertension diagnosis, BMI 41  P-Recheck BP on Tuesday; decrease salty food intake discussed starting low dose bp medication again if bp greater than 130/80 consistently.  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  Will modify diet to decrease salt and processed foods between now and Tuesday.  Discussed if BP decreases to 130s/80s will continue to monitor with her weight loss.  Congratulated patient on her weight loss 6 lbs and healthy lifestyle choices and encouraged her to continue.  She is feeling better/foot pain resolved.  Patient agreed with plan of care and had no further questions at this time.

## 2023-01-11 NOTE — Patient Instructions (Signed)

## 2023-01-13 NOTE — Telephone Encounter (Signed)
See office note 01/10/23 BP not at goal but 6 lb weight loss noted intentional

## 2023-01-17 ENCOUNTER — Ambulatory Visit: Payer: Self-pay | Admitting: Registered Nurse

## 2023-01-17 ENCOUNTER — Encounter: Payer: Self-pay | Admitting: Registered Nurse

## 2023-01-17 VITALS — BP 130/90 | HR 100 | Resp 16

## 2023-01-17 DIAGNOSIS — R030 Elevated blood-pressure reading, without diagnosis of hypertension: Secondary | ICD-10-CM

## 2023-01-17 NOTE — Progress Notes (Signed)
Subjective:    Patient ID: Kathleen Silva, female    DOB: 25-Jan-1961, 62 y.o.   MRN: [id number]  62y/o african [place] established female here for bp CHECK.   Denied headache/pain.  Had crackers and coconut water this am skipped breakfast so had snack.  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  Wondering if she should start magnesium supplement, have vitamin D level recheck--she has noticed breasts are less sensitive since starting vitamin D.  Still taking garlique and chia seeds.  Didn't like taste of coconut water today.  Denied headache/chest pain/shortness of breath or visual changes.      Review of Systems  Constitutional:  Negative for chills, fatigue and fever.  HENT:  Negative for trouble swallowing and voice change.   Eyes:  Negative for photophobia and visual disturbance.  Respiratory:  Negative for cough, shortness of breath, wheezing and stridor.   Cardiovascular:  Negative for chest pain.  Gastrointestinal:  Negative for diarrhea, nausea and vomiting.  Genitourinary:  Negative for difficulty urinating.  Musculoskeletal:  Negative for gait problem.  Neurological:  Negative for dizziness, tremors, syncope, facial asymmetry, speech difficulty, weakness, light-headedness and headaches.  Psychiatric/Behavioral:  Negative for agitation, confusion and sleep disturbance.        Objective:   Physical Exam Vitals and nursing note reviewed.  Constitutional:      General: She is awake. She is not in acute distress.    Appearance: Normal appearance. She is well-developed and well-groomed. She is obese. She is not ill-appearing, toxic-appearing or diaphoretic.  HENT:     Head: Normocephalic and atraumatic.     Jaw: There is normal jaw occlusion.     Salivary Glands: Right salivary gland is not diffusely enlarged. Left salivary gland is not diffusely enlarged.     Right Ear: Hearing and external ear normal.     Left Ear: Hearing and external ear normal.     Nose: Nose normal. No  congestion or rhinorrhea.     Mouth/Throat:     Lips: Pink. No lesions.     Mouth: Mucous membranes are moist.     Pharynx: Oropharynx is clear.  Eyes:     General: Lids are normal. Vision grossly intact. Gaze aligned appropriately. No scleral icterus.       Right eye: No discharge.        Left eye: No discharge.     Extraocular Movements: Extraocular movements intact.     Conjunctiva/sclera: Conjunctivae normal.     Pupils: Pupils are equal, round, and reactive to light.  Neck:     Trachea: Trachea normal.  Cardiovascular:     Rate and Rhythm: Normal rate and regular rhythm.     Pulses: Normal pulses.          Radial pulses are 2+ on the right side and 2+ on the left side.  Pulmonary:     Effort: Pulmonary effort is normal.     Breath sounds: Normal breath sounds and air entry. No stridor or transmitted upper airway sounds. No wheezing.     Comments: Spoke full sentences without difficulty; no cough observed in exam room Abdominal:     Palpations: Abdomen is soft.  Musculoskeletal:        General: Normal range of motion.     Cervical back: Normal range of motion and neck supple. No rigidity.     Right lower leg: No edema.     Left lower leg: No edema.  Lymphadenopathy:  Head:     Right side of head: No submandibular or preauricular adenopathy.     Left side of head: No submandibular or preauricular adenopathy.     Cervical: No cervical adenopathy.     Right cervical: No superficial cervical adenopathy.    Left cervical: No superficial cervical adenopathy.  Skin:    General: Skin is warm and dry.     Capillary Refill: Capillary refill takes less than 2 seconds.     Coloration: Skin is not ashen, cyanotic, jaundiced, mottled, pale or sallow.     Findings: No abrasion, bruising, burn, erythema, signs of injury, laceration, lesion, petechiae, rash or wound.  Neurological:     General: No focal deficit present.     Mental Status: She is alert and oriented to person, place,  and time. Mental status is at baseline.     GCS: GCS eye subscore is 4. GCS verbal subscore is 5. GCS motor subscore is 6.     Cranial Nerves: No cranial nerve deficit, dysarthria or facial asymmetry.     Motor: Motor function is intact. No weakness, tremor, atrophy, abnormal muscle tone or seizure activity.     Coordination: Coordination is intact. Coordination normal.     Gait: Gait is intact. Gait normal.     Comments: In/out of chair without difficulty; gait sure and steady in clinic; bilateral hand grasp equal 5/5  Psychiatric:        Attention and Perception: Attention and perception normal.        Mood and Affect: Mood and affect normal.        Speech: Speech normal.        Behavior: Behavior normal. Behavior is cooperative.        Thought Content: Thought content normal.        Cognition and Memory: Cognition and memory normal.        Judgment: Judgment normal.      Patient refused BP recheck in 5 minutes stated will come to clinic next week to see RN Burna Mortimer.     Assessment & Plan:   A-elevated blood pressure  P-patient does not want to start medications/prefers weight loss and dietary changes.  patient going to cut down on added salt in diet.  Stopping coconut water didn't like taste anyway.  Will try magnesium 300mg  po daily evening OTC and to see if helps with her sleep also.  Discussed nuts a good source of magnesium 1/4 cup per day is a serving.  FDA recommends 340mg  magnesium per day for women her age between food/supplements  Denied constipation/diarrhea/muscle cramps/leg pain/chest pain/headache.  Discussed dash diet again with patient.  Exitcare handouts preventing hypertension and dash diet.  Patient verbalized understanding information/instructions and had no further questions at that time.  Discussed with patient continue 1000 units po daily with meal as on her multivitamin level 32 this year.  Discussed new national guidelines with patient regarding Vitamin D  supplements no further need for retesting on low daily dose.  Discussed purpose of vitamin D in the body  Patient verbalized understanding information and had no further questions at this time.

## 2023-01-17 NOTE — Patient Instructions (Signed)
DASH Eating Plan DASH stands for Dietary Approaches to Stop Hypertension. The DASH eating plan is a healthy eating plan that has been shown to: Lower high blood pressure (hypertension). Reduce your risk for type 2 diabetes, heart disease, and stroke. Help with weight loss. What are tips for following this plan? Reading food labels Check food labels for the amount of salt (sodium) per serving. Choose foods with less than 5 percent of the Daily Value (DV) of sodium. In general, foods with less than 300 milligrams (mg) of sodium per serving fit into this eating plan. To find whole grains, look for the word "whole" as the first word in the ingredient list. Shopping Buy products labeled as "low-sodium" or "no salt added." Buy fresh foods. Avoid canned foods and pre-made or frozen meals. Cooking Try not to add salt when you cook. Use salt-free seasonings or herbs instead of table salt or sea salt. Check with your health care provider or pharmacist before using salt substitutes. Do not fry foods. Cook foods in healthy ways, such as baking, boiling, grilling, roasting, or broiling. Cook using oils that are good for your heart. These include olive, canola, avocado, soybean, and sunflower oil. Meal planning  Eat a balanced diet. This should include: 4 or more servings of fruits and 4 or more servings of vegetables each day. Try to fill half of your plate with fruits and vegetables. 6-8 servings of whole grains each day. 6 or less servings of lean meat, poultry, or fish each day. 1 oz is 1 serving. A 3 oz (85 g) serving of meat is about the same size as the palm of your hand. One egg is 1 oz (28 g). 2-3 servings of low-fat dairy each day. One serving is 1 cup (237 mL). 1 serving of nuts, seeds, or beans 5 times each week. 2-3 servings of heart-healthy fats. Healthy fats called omega-3 fatty acids are found in foods such as walnuts, flaxseeds, fortified milks, and eggs. These fats are also found in  cold-water fish, such as sardines, salmon, and mackerel. Limit how much you eat of: Canned or prepackaged foods. Food that is high in trans fat, such as fried foods. Food that is high in saturated fat, such as fatty meat. Desserts and other sweets, sugary drinks, and other foods with added sugar. Full-fat dairy products. Do not salt foods before eating. Do not eat more than 4 egg yolks a week. Try to eat at least 2 vegetarian meals a week. Eat more home-cooked food and less restaurant, buffet, and fast food. Lifestyle When eating at a restaurant, ask if your food can be made with less salt or no salt. If you drink alcohol: Limit how much you have to: 0-1 drink a day if you are female. 0-2 drinks a day if you are female. Know how much alcohol is in your drink. In the [place] one drink is one 12 oz bottle of beer (355 mL), one 5 oz glass of wine (148 mL), or one 1 oz glass of hard liquor (44 mL). General information Avoid eating more than 2,300 mg of salt a day. If you have hypertension, you may need to reduce your sodium intake to 1,500 mg a day. Work with your provider to stay at a healthy body weight or lose weight. Ask what the best weight range is for you. On most days of the week, get at least 30 minutes of exercise that causes your heart to beat faster. This may include walking, swimming, or  biking. Work with your provider or dietitian to adjust your eating plan to meet your specific calorie needs. What foods should I eat? Fruits All fresh, dried, or frozen fruit. Canned fruits that are in their natural juice and do not have sugar added to them. Vegetables Fresh or frozen vegetables that are raw, steamed, roasted, or grilled. Low-sodium or reduced-sodium tomato and vegetable juice. Low-sodium or reduced-sodium tomato sauce and tomato paste. Low-sodium or reduced-sodium canned vegetables. Grains Whole-grain or whole-wheat bread. Whole-grain or whole-wheat pasta. Brown rice. Orpah Cobb. Bulgur. Whole-grain and low-sodium cereals. Pita bread. Low-fat, low-sodium crackers. Whole-wheat flour tortillas. Meats and other proteins Skinless chicken or [place]. Ground chicken or [place]. Pork with fat trimmed off. Fish and seafood. Egg whites. Dried beans, peas, or lentils. Unsalted nuts, nut butters, and seeds. Unsalted canned beans. Lean cuts of beef with fat trimmed off. Low-sodium, lean precooked or cured meat, such as sausages or meat loaves. Dairy Low-fat (1%) or fat-free (skim) milk. Reduced-fat, low-fat, or fat-free cheeses. Nonfat, low-sodium ricotta or cottage cheese. Low-fat or nonfat yogurt. Low-fat, low-sodium cheese. Fats and oils Soft margarine without trans fats. Vegetable oil. Reduced-fat, low-fat, or light mayonnaise and salad dressings (reduced-sodium). Canola, safflower, olive, avocado, soybean, and sunflower oils. Avocado. Seasonings and condiments Herbs. Spices. Seasoning mixes without salt. Other foods Unsalted popcorn and pretzels. Fat-free sweets. The items listed above may not be all the foods and drinks you can have. Talk to a dietitian to learn more. What foods should I avoid? Fruits Canned fruit in a light or heavy syrup. Fried fruit. Fruit in cream or butter sauce. Vegetables Creamed or fried vegetables. Vegetables in a cheese sauce. Regular canned vegetables that are not marked as low-sodium or reduced-sodium. Regular canned tomato sauce and paste that are not marked as low-sodium or reduced-sodium. Regular tomato and vegetable juices that are not marked as low-sodium or reduced-sodium. Rosita Fire. Olives. Grains Baked goods made with fat, such as croissants, muffins, or some breads. Dry pasta or rice meal packs. Meats and other proteins Fatty cuts of meat. Ribs. Fried meat. Tomasa Blase. Bologna, salami, and other precooked or cured meats, such as sausages or meat loaves, that are not lean and low in sodium. Fat from the back of a pig (fatback). Bratwurst.  Salted nuts and seeds. Canned beans with added salt. Canned or smoked fish. Whole eggs or egg yolks. Chicken or [place] with skin. Dairy Whole or 2% milk, cream, and half-and-half. Whole or full-fat cream cheese. Whole-fat or sweetened yogurt. Full-fat cheese. Nondairy creamers. Whipped toppings. Processed cheese and cheese spreads. Fats and oils Butter. Stick margarine. Lard. Shortening. Ghee. Bacon fat. Tropical oils, such as coconut, palm kernel, or palm oil. Seasonings and condiments Onion salt, garlic salt, seasoned salt, table salt, and sea salt. Worcestershire sauce. Tartar sauce. Barbecue sauce. Teriyaki sauce. Soy sauce, including reduced-sodium soy sauce. Steak sauce. Canned and packaged gravies. Fish sauce. Oyster sauce. Cocktail sauce. Store-bought horseradish. Ketchup. Mustard. Meat flavorings and tenderizers. Bouillon cubes. Hot sauces. Pre-made or packaged marinades. Pre-made or packaged taco seasonings. Relishes. Regular salad dressings. Other foods Salted popcorn and pretzels. The items listed above may not be all the foods and drinks you should avoid. Talk to a dietitian to learn more. Where to find more information National Heart, Lung, and Blood Institute (NHLBI): BuffaloDryCleaner.gl American Heart Association (AHA): heart.org Academy of Nutrition and Dietetics: eatright.org National Kidney Foundation (NKF): kidney.org This information is not intended to replace advice given to you by your health care provider. Make sure  you discuss any questions you have with your health care provider. Document Revised: 05/10/2022 Document Reviewed: 05/10/2022 Elsevier Patient Education  2024 [employer] Preventing Hypertension Hypertension, also called high blood pressure, is when the force of blood pumping through the arteries is too strong. Arteries are blood vessels that carry blood from the heart throughout the body. Often, hypertension does not cause symptoms until blood pressure is very  high. It is important to have your blood pressure checked regularly. Diet and lifestyle changes can help you prevent hypertension, and they may make you feel better overall and improve your quality of life. If you already have hypertension, you may control it with diet and lifestyle changes, as well as with medicine. How can this condition affect me? Over time, hypertension can damage the arteries and decrease blood flow to important parts of the body, including the brain, heart, and kidneys. By keeping your blood pressure in a healthy range, you can help prevent complications like heart attack, heart failure, stroke, kidney failure, and vascular dementia. What can increase my risk? An unhealthy diet and a lack of physical activity can make you more likely to develop high blood pressure. Some other risk factors include: Age. The risk increases with age. Having family members who have had high blood pressure. Having certain health conditions, such as thyroid problems. Being overweight or obese. Drinking too much alcohol or caffeine. Having too much fat, sugar, calories, or salt (sodium) in your diet. Smoking or using illegal drugs. Taking certain medicines, such as antidepressants, decongestants, birth control pills, and NSAIDs, such as ibuprofen. What actions can I take to prevent or manage this condition? Work with your health care provider to make a hypertension prevention plan that works for you. You may be referred for counseling on a healthy diet and physical activity. Follow your plan and keep all follow-up visits. Diet changes Maintain a healthy diet. This includes: Eating less salt (sodium). Ask your health care provider how much sodium is safe for you to have. The general recommendation is to have less than 1 tsp (2,300 mg) of sodium a day. Do not add salt to your food. Choose low-sodium options when grocery shopping and eating out. Limiting fats in your diet. You can do this by eating  low-fat or fat-free dairy products and by eating less red meat. Eating more fruits, vegetables, and whole grains. Make a goal to eat: 1-2 cups of fresh fruits and vegetables each day. 3-4 servings of whole grains each day. Avoiding foods and beverages that have added sugars. Eating fish that contain healthy fats (omega-3 fatty acids), such as mackerel or salmon. If you need help putting together a healthy eating plan, try the DASH diet. This diet is high in fruits, vegetables, and whole grains. It is low in sodium, red meat, and added sugars. DASH stands for Dietary Approaches to Stop Hypertension. Lifestyle changes  Lose weight if you are overweight. Losing just 3-5% of your body weight can help prevent or control hypertension. For example, if your present weight is 200 lb (91 kg), a loss of 3-5% of your weight means losing 6-10 lb (2.7-4.5 kg). Ask your health care provider to help you with a diet and exercise plan to safely lose weight. Get enough exercise. Do at least 150 minutes of moderate-intensity exercise each week. You could do this in short exercise sessions several times a day, or you could do longer exercise sessions a few times a week. For example, you could take a brisk  10-minute walk or bike ride, 3 times a day, for 5 days a week. Find ways to reduce stress, such as exercising, meditating, listening to music, or taking a yoga class. If you need help reducing stress, ask your health care provider. Do not use any products that contain nicotine or tobacco. These products include cigarettes, chewing tobacco, and vaping devices, such as e-cigarettes. Chemicals in tobacco and nicotine products raise your blood pressure each time you use them. If you need help quitting, ask your health care provider. Learn how to check your blood pressure at home. Make sure that you know your personal target blood pressure, as told by your health care provider. Try to sleep 7-9 hours per night. Alcohol  use Do not drink alcohol if: Your health care provider tells you not to drink. You are pregnant, may be pregnant, or are planning to become pregnant. If you drink alcohol: Limit how much you have to: 0-1 drink a day for women. 0-2 drinks a day for men. Know how much alcohol is in your drink. In the [place] one drink equals one 12 oz bottle of beer (355 mL), one 5 oz glass of wine (148 mL), or one 1 oz glass of hard liquor (44 mL). Medicines In addition to diet and lifestyle changes, your health care provider may recommend medicines to help lower your blood pressure. In general: You may need to try a few different medicines to find what works best for you. You may need to take more than one medicine. Take over-the-counter and prescription medicines only as told by your health care provider. Questions to ask your health care provider What is my blood pressure goal? How can I lower my risk for high blood pressure? How should I monitor my blood pressure at home? Where to find support Your health care provider can help you prevent hypertension and help you keep your blood pressure at a healthy level. Your local hospital or your community may also provide support services and prevention programs. The American Heart Association offers an online support network at supportnetwork.heart.org Where to find more information Learn more about hypertension from: National Heart, Lung, and Blood Institute: PopSteam.is Centers for Disease Control and Prevention: FootballExhibition.com.br American Academy of Family Physicians: familydoctor.org Learn more about the DASH diet from: National Heart, Lung, and Blood Institute: PopSteam.is Contact a health care provider if: You think you are having a reaction to medicines you have taken. You have recurrent headaches or feel dizzy. You have swelling in your ankles. You have trouble with your vision. Get help right away if: You have sudden, severe chest, back, or  abdominal pain or discomfort. You have shortness of breath. You have a sudden, severe headache. These symptoms may be an emergency. Get help right away. Call 911. Do not wait to see if the symptoms will go away. Do not drive yourself to the hospital. Summary Hypertension often does not cause any symptoms until blood pressure is very high. It is important to get your blood pressure checked regularly. Diet and lifestyle changes are important steps in preventing hypertension. By keeping your blood pressure in a healthy range, you may prevent complications like heart attack, heart failure, stroke, and kidney failure. Work with your health care provider to make a hypertension prevention plan that works for you. This information is not intended to replace advice given to you by your health care provider. Make sure you discuss any questions you have with your health care provider. Document Revised: 02/09/2021 Document Reviewed:  02/09/2021 Elsevier Patient Education  2024 [employer].

## 2023-02-13 NOTE — Telephone Encounter (Signed)
Discussed with patient come to clinic between 08-2pm for BP/weight recheck this week (Thursday)  Patient agreed with plan of care and had no further questions at that time.  Feeling well denied concerns seen in [facility]center.

## 2023-02-14 ENCOUNTER — Ambulatory Visit: Payer: Self-pay

## 2023-02-14 VITALS — BP 140/90 | HR 67

## 2023-02-14 DIAGNOSIS — R030 Elevated blood-pressure reading, without diagnosis of hypertension: Secondary | ICD-10-CM

## 2023-02-14 NOTE — Progress Notes (Signed)
Pt in clinic today for a routine BP check. Pt states that she has been trying to watch her diet and decrease her sodium intake and increase her water intake. Denies Headache at this time.

## 2023-02-20 NOTE — Telephone Encounter (Signed)
Patient seen in workcenter denied concerns A&Ox3 respirations even and unlabored spoke full sentences without difficulty skin warm dry and pink gait sure and steady.  Reminded patient RN Chantel and I are in clinic Thursday 08a-5p for repeat BP/weight check.  Patient stated she would come to clinic.

## 2023-03-15 NOTE — Telephone Encounter (Signed)
Patient seen in workcenter stated tooth pain finally improved will come to clinic next week for weight/BP check.  Has follow up scheduled with her dentist.  Tooth extraction upper left healing and pain decreasing.

## 2023-03-20 NOTE — Telephone Encounter (Signed)
Patient seen in workcenter stated she would come to clinic Wed or Thursday this week for BP and weight check

## 2023-03-21 ENCOUNTER — Encounter: Payer: Self-pay | Admitting: Registered Nurse

## 2023-03-21 ENCOUNTER — Ambulatory Visit: Payer: Self-pay | Admitting: Registered Nurse

## 2023-03-21 VITALS — BP 140/90 | HR 80 | Resp 16 | Ht 68.0 in | Wt 266.0 lb

## 2023-03-21 DIAGNOSIS — Z6841 Body Mass Index (BMI) 40.0 and over, adult: Secondary | ICD-10-CM

## 2023-03-21 DIAGNOSIS — R030 Elevated blood-pressure reading, without diagnosis of hypertension: Secondary | ICD-10-CM

## 2023-03-21 NOTE — Patient Instructions (Signed)
Preventing Hypertension Hypertension, also called high blood pressure, is when the force of blood pumping through the arteries is too strong. Arteries are blood vessels that carry blood from the heart throughout the body. Often, hypertension does not cause symptoms until blood pressure is very high. It is important to have your blood pressure checked regularly. Diet and lifestyle changes can help you prevent hypertension, and they may make you feel better overall and improve your quality of life. If you already have hypertension, you may control it with diet and lifestyle changes, as well as with medicine. How can this condition affect me? Over time, hypertension can damage the arteries and decrease blood flow to important parts of the body, including the brain, heart, and kidneys. By keeping your blood pressure in a healthy range, you can help prevent complications like heart attack, heart failure, stroke, kidney failure, and vascular dementia. What can increase my risk? An unhealthy diet and a lack of physical activity can make you more likely to develop high blood pressure. Some other risk factors include: Age. The risk increases with age. Having family members who have had high blood pressure. Having certain health conditions, such as thyroid problems. Being overweight or obese. Drinking too much alcohol or caffeine. Having too much fat, sugar, calories, or salt (sodium) in your diet. Smoking or using illegal drugs. Taking certain medicines, such as antidepressants, decongestants, birth control pills, and NSAIDs, such as ibuprofen. What actions can I take to prevent or manage this condition? Work with your health care provider to make a hypertension prevention plan that works for you. You may be referred for counseling on a healthy diet and physical activity. Follow your plan and keep all follow-up visits. Diet changes Maintain a healthy diet. This includes: Eating less salt (sodium). Ask your  health care provider how much sodium is safe for you to have. The general recommendation is to have less than 1 tsp (2,300 mg) of sodium a day. Do not add salt to your food. Choose low-sodium options when grocery shopping and eating out. Limiting fats in your diet. You can do this by eating low-fat or fat-free dairy products and by eating less red meat. Eating more fruits, vegetables, and whole grains. Make a goal to eat: 1-2 cups of fresh fruits and vegetables each day. 3-4 servings of whole grains each day. Avoiding foods and beverages that have added sugars. Eating fish that contain healthy fats (omega-3 fatty acids), such as mackerel or salmon. If you need help putting together a healthy eating plan, try the DASH diet. This diet is high in fruits, vegetables, and whole grains. It is low in sodium, red meat, and added sugars. DASH stands for Dietary Approaches to Stop Hypertension. Lifestyle changes  Lose weight if you are overweight. Losing just 3-5% of your body weight can help prevent or control hypertension. For example, if your present weight is 200 lb (91 kg), a loss of 3-5% of your weight means losing 6-10 lb (2.7-4.5 kg). Ask your health care provider to help you with a diet and exercise plan to safely lose weight. Get enough exercise. Do at least 150 minutes of moderate-intensity exercise each week. You could do this in short exercise sessions several times a day, or you could do longer exercise sessions a few times a week. For example, you could take a brisk 10-minute walk or bike ride, 3 times a day, for 5 days a week. Find ways to reduce stress, such as exercising, meditating, listening to  music, or taking a yoga class. If you need help reducing stress, ask your health care provider. Do not use any products that contain nicotine or tobacco. These products include cigarettes, chewing tobacco, and vaping devices, such as e-cigarettes. Chemicals in tobacco and nicotine products raise your  blood pressure each time you use them. If you need help quitting, ask your health care provider. Learn how to check your blood pressure at home. Make sure that you know your personal target blood pressure, as told by your health care provider. Try to sleep 7-9 hours per night. Alcohol use Do not drink alcohol if: Your health care provider tells you not to drink. You are pregnant, may be pregnant, or are planning to become pregnant. If you drink alcohol: Limit how much you have to: 0-1 drink a day for women. 0-2 drinks a day for men. Know how much alcohol is in your drink. In the [place] one drink equals one 12 oz bottle of beer (355 mL), one 5 oz glass of wine (148 mL), or one 1 oz glass of hard liquor (44 mL). Medicines In addition to diet and lifestyle changes, your health care provider may recommend medicines to help lower your blood pressure. In general: You may need to try a few different medicines to find what works best for you. You may need to take more than one medicine. Take over-the-counter and prescription medicines only as told by your health care provider. Questions to ask your health care provider What is my blood pressure goal? How can I lower my risk for high blood pressure? How should I monitor my blood pressure at home? Where to find support Your health care provider can help you prevent hypertension and help you keep your blood pressure at a healthy level. Your local hospital or your community may also provide support services and prevention programs. The American Heart Association offers an online support network at supportnetwork.heart.org Where to find more information Learn more about hypertension from: National Heart, Lung, and Blood Institute: PopSteam.is Centers for Disease Control and Prevention: FootballExhibition.com.br American Academy of Family Physicians: familydoctor.org Learn more about the DASH diet from: National Heart, Lung, and Blood Institute:  PopSteam.is Contact a health care provider if: You think you are having a reaction to medicines you have taken. You have recurrent headaches or feel dizzy. You have swelling in your ankles. You have trouble with your vision. Get help right away if: You have sudden, severe chest, back, or abdominal pain or discomfort. You have shortness of breath. You have a sudden, severe headache. These symptoms may be an emergency. Get help right away. Call 911. Do not wait to see if the symptoms will go away. Do not drive yourself to the hospital. Summary Hypertension often does not cause any symptoms until blood pressure is very high. It is important to get your blood pressure checked regularly. Diet and lifestyle changes are important steps in preventing hypertension. By keeping your blood pressure in a healthy range, you may prevent complications like heart attack, heart failure, stroke, and kidney failure. Work with your health care provider to make a hypertension prevention plan that works for you. This information is not intended to replace advice given to you by your health care provider. Make sure you discuss any questions you have with your health care provider. Document Revised: 02/09/2021 Document Reviewed: 02/09/2021 Elsevier Patient Education  2024 Elsevier Inc. DASH Eating Plan DASH stands for Dietary Approaches to Stop Hypertension. The DASH eating plan is a  healthy eating plan that has been shown to: Lower high blood pressure (hypertension). Reduce your risk for type 2 diabetes, heart disease, and stroke. Help with weight loss. What are tips for following this plan? Reading food labels Check food labels for the amount of salt (sodium) per serving. Choose foods with less than 5 percent of the Daily Value (DV) of sodium. In general, foods with less than 300 milligrams (mg) of sodium per serving fit into this eating plan. To find whole grains, look for the word "whole" as the first  word in the ingredient list. Shopping Buy products labeled as "low-sodium" or "no salt added." Buy fresh foods. Avoid canned foods and pre-made or frozen meals. Cooking Try not to add salt when you cook. Use salt-free seasonings or herbs instead of table salt or sea salt. Check with your health care provider or pharmacist before using salt substitutes. Do not fry foods. Cook foods in healthy ways, such as baking, boiling, grilling, roasting, or broiling. Cook using oils that are good for your heart. These include olive, canola, avocado, soybean, and sunflower oil. Meal planning  Eat a balanced diet. This should include: 4 or more servings of fruits and 4 or more servings of vegetables each day. Try to fill half of your plate with fruits and vegetables. 6-8 servings of whole grains each day. 6 or less servings of lean meat, poultry, or fish each day. 1 oz is 1 serving. A 3 oz (85 g) serving of meat is about the same size as the palm of your hand. One egg is 1 oz (28 g). 2-3 servings of low-fat dairy each day. One serving is 1 cup (237 mL). 1 serving of nuts, seeds, or beans 5 times each week. 2-3 servings of heart-healthy fats. Healthy fats called omega-3 fatty acids are found in foods such as walnuts, flaxseeds, fortified milks, and eggs. These fats are also found in cold-water fish, such as sardines, salmon, and mackerel. Limit how much you eat of: Canned or prepackaged foods. Food that is high in trans fat, such as fried foods. Food that is high in saturated fat, such as fatty meat. Desserts and other sweets, sugary drinks, and other foods with added sugar. Full-fat dairy products. Do not salt foods before eating. Do not eat more than 4 egg yolks a week. Try to eat at least 2 vegetarian meals a week. Eat more home-cooked food and less restaurant, buffet, and fast food. Lifestyle When eating at a restaurant, ask if your food can be made with less salt or no salt. If you drink  alcohol: Limit how much you have to: 0-1 drink a day if you are female. 0-2 drinks a day if you are female. Know how much alcohol is in your drink. In the [place] one drink is one 12 oz bottle of beer (355 mL), one 5 oz glass of wine (148 mL), or one 1 oz glass of hard liquor (44 mL). General information Avoid eating more than 2,300 mg of salt a day. If you have hypertension, you may need to reduce your sodium intake to 1,500 mg a day. Work with your provider to stay at a healthy body weight or lose weight. Ask what the best weight range is for you. On most days of the week, get at least 30 minutes of exercise that causes your heart to beat faster. This may include walking, swimming, or biking. Work with your provider or dietitian to adjust your eating plan to meet your specific  calorie needs. What foods should I eat? Fruits All fresh, dried, or frozen fruit. Canned fruits that are in their natural juice and do not have sugar added to them. Vegetables Fresh or frozen vegetables that are raw, steamed, roasted, or grilled. Low-sodium or reduced-sodium tomato and vegetable juice. Low-sodium or reduced-sodium tomato sauce and tomato paste. Low-sodium or reduced-sodium canned vegetables. Grains Whole-grain or whole-wheat bread. Whole-grain or whole-wheat pasta. Brown rice. Orpah Cobb. Bulgur. Whole-grain and low-sodium cereals. Pita bread. Low-fat, low-sodium crackers. Whole-wheat flour tortillas. Meats and other proteins Skinless chicken or [place]. Ground chicken or [place]. Pork with fat trimmed off. Fish and seafood. Egg whites. Dried beans, peas, or lentils. Unsalted nuts, nut butters, and seeds. Unsalted canned beans. Lean cuts of beef with fat trimmed off. Low-sodium, lean precooked or cured meat, such as sausages or meat loaves. Dairy Low-fat (1%) or fat-free (skim) milk. Reduced-fat, low-fat, or fat-free cheeses. Nonfat, low-sodium ricotta or cottage cheese. Low-fat or nonfat yogurt. Low-fat,  low-sodium cheese. Fats and oils Soft margarine without trans fats. Vegetable oil. Reduced-fat, low-fat, or light mayonnaise and salad dressings (reduced-sodium). Canola, safflower, olive, avocado, soybean, and sunflower oils. Avocado. Seasonings and condiments Herbs. Spices. Seasoning mixes without salt. Other foods Unsalted popcorn and pretzels. Fat-free sweets. The items listed above may not be all the foods and drinks you can have. Talk to a dietitian to learn more. What foods should I avoid? Fruits Canned fruit in a light or heavy syrup. Fried fruit. Fruit in cream or butter sauce. Vegetables Creamed or fried vegetables. Vegetables in a cheese sauce. Regular canned vegetables that are not marked as low-sodium or reduced-sodium. Regular canned tomato sauce and paste that are not marked as low-sodium or reduced-sodium. Regular tomato and vegetable juices that are not marked as low-sodium or reduced-sodium. Rosita Fire. Olives. Grains Baked goods made with fat, such as croissants, muffins, or some breads. Dry pasta or rice meal packs. Meats and other proteins Fatty cuts of meat. Ribs. Fried meat. Tomasa Blase. Bologna, salami, and other precooked or cured meats, such as sausages or meat loaves, that are not lean and low in sodium. Fat from the back of a pig (fatback). Bratwurst. Salted nuts and seeds. Canned beans with added salt. Canned or smoked fish. Whole eggs or egg yolks. Chicken or [place] with skin. Dairy Whole or 2% milk, cream, and half-and-half. Whole or full-fat cream cheese. Whole-fat or sweetened yogurt. Full-fat cheese. Nondairy creamers. Whipped toppings. Processed cheese and cheese spreads. Fats and oils Butter. Stick margarine. Lard. Shortening. Ghee. Bacon fat. Tropical oils, such as coconut, palm kernel, or palm oil. Seasonings and condiments Onion salt, garlic salt, seasoned salt, table salt, and sea salt. Worcestershire sauce. Tartar sauce. Barbecue sauce. Teriyaki sauce. Soy sauce,  including reduced-sodium soy sauce. Steak sauce. Canned and packaged gravies. Fish sauce. Oyster sauce. Cocktail sauce. Store-bought horseradish. Ketchup. Mustard. Meat flavorings and tenderizers. Bouillon cubes. Hot sauces. Pre-made or packaged marinades. Pre-made or packaged taco seasonings. Relishes. Regular salad dressings. Other foods Salted popcorn and pretzels. The items listed above may not be all the foods and drinks you should avoid. Talk to a dietitian to learn more. Where to find more information National Heart, Lung, and Blood Institute (NHLBI): BuffaloDryCleaner.gl American Heart Association (AHA): heart.org Academy of Nutrition and Dietetics: eatright.org National Kidney Foundation (NKF): kidney.org This information is not intended to replace advice given to you by your health care provider. Make sure you discuss any questions you have with your health care provider. Document Revised: 05/10/2022 Document Reviewed:  05/10/2022 Elsevier Patient Education  2024 [employer].

## 2023-03-21 NOTE — Telephone Encounter (Signed)
See office note patient had BP and weight check in clinic today has lost 10 lbs since Be Well visit May 2024

## 2023-03-21 NOTE — Progress Notes (Signed)
Subjective:    Patient ID: Kathleen Silva, female    DOB: 08/13/1960, 62 y.o.   MRN: [id number]  62y/o established african [place] female stated has started new turmeric pomegranate drink 8 oz for breakfast.  Has increased jumping on trampoline from 50 to 100 to 150 and now at 200 reps per evening.  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  Clothes are fitting looser.  Trying to make healthy dietary choices.  Had follow up with dentist this week and blood pressure has been good at dentist. She was surprised to hear from dentist many people get nervous at dentist or require medication to make them sleepy during teeth extraction.  Patient reported she does get nervous coming to [facility] clinic for BP checks. Denied dental or foot pain today.  Foot pain has resolved with stretching/changing shoes.      Review of Systems  Constitutional:  Negative for chills and fever.  HENT:  Negative for trouble swallowing and voice change.   Eyes:  Negative for visual disturbance.  Respiratory:  Negative for shortness of breath, wheezing and stridor.   Cardiovascular:  Negative for palpitations.  Gastrointestinal:  Negative for diarrhea, nausea and vomiting.  Genitourinary:  Negative for difficulty urinating.  Musculoskeletal:  Negative for gait problem and myalgias.  Skin:  Negative for color change and rash.  Neurological:  Negative for dizziness, tremors, seizures, syncope, speech difficulty, weakness, light-headedness and headaches.  Psychiatric/Behavioral:  Negative for agitation, confusion and sleep disturbance.        Objective:   Physical Exam Vitals reviewed.  Constitutional:      General: She is awake. She is not in acute distress.    Appearance: Normal appearance. She is well-developed and well-groomed. She is morbidly obese. She is not ill-appearing, toxic-appearing or diaphoretic.  HENT:     Head: Normocephalic and atraumatic.      Jaw: There is normal jaw occlusion.     Salivary Glands: Right salivary gland is not diffusely enlarged. Left salivary gland is not diffusely enlarged.     Right Ear: Hearing and external ear normal. No decreased hearing noted.     Left Ear: Hearing and external ear normal. No decreased hearing noted.     Nose: Nose normal. No congestion or rhinorrhea.     Mouth/Throat:     Lips: Pink. No lesions.     Mouth: Mucous membranes are moist. No oral lesions or angioedema.     Dentition: No gum lesions.     Tongue: No lesions. Tongue does not deviate from midline.     Palate: No mass and lesions.     Pharynx: Oropharynx is clear. Uvula midline.  Eyes:     General: Lids are normal. Vision grossly intact. Gaze aligned appropriately. Allergic shiner present. No scleral icterus.       Right eye: No discharge.        Left eye: No discharge.     Extraocular Movements: Extraocular movements intact.     Conjunctiva/sclera: Conjunctivae normal.     Pupils: Pupils are equal, round, and reactive to light.  Neck:     Trachea: Trachea and phonation normal.  Cardiovascular:     Rate and Rhythm: Normal rate and regular rhythm.     Pulses: Normal pulses.          Radial pulses are 2+ on the right side and 2+ on the left side.  Pulmonary:     Effort: Pulmonary  effort is normal. No respiratory distress.     Breath sounds: Normal breath sounds and air entry. No stridor, decreased air movement or transmitted upper airway sounds. No decreased breath sounds, wheezing, rhonchi or rales.     Comments: Spoke full sentences without difficulty; no cough observed in exam room Abdominal:     General: Abdomen is flat.     Palpations: Abdomen is soft.  Musculoskeletal:        General: Normal range of motion.     Right hand: Normal strength. Normal capillary refill.     Left hand: Normal strength. Normal capillary refill.     Cervical back: Normal range of motion and neck supple. No swelling, edema, deformity,  erythema, signs of trauma, lacerations, rigidity, spasms, torticollis, tenderness or crepitus. No pain with movement. Normal range of motion.     Right lower leg: No edema.     Left lower leg: No edema.     Right ankle: No swelling.     Left ankle: No swelling.  Lymphadenopathy:     Head:     Right side of head: No submandibular or preauricular adenopathy.     Left side of head: No submandibular or preauricular adenopathy.     Cervical: No cervical adenopathy.     Right cervical: No superficial cervical adenopathy.    Left cervical: No superficial cervical adenopathy.  Skin:    General: Skin is warm and dry.     Capillary Refill: Capillary refill takes less than 2 seconds.     Coloration: Skin is not ashen, cyanotic, jaundiced, mottled, pale or sallow.     Findings: No abrasion, abscess, acne, bruising, burn, ecchymosis, erythema, signs of injury, laceration, lesion, petechiae, rash or wound.     Nails: There is no clubbing.     Comments: Face/neck/forearms/hands visually inspected  Neurological:     General: No focal deficit present.     Mental Status: She is alert and oriented to person, place, and time. Mental status is at baseline.     GCS: GCS eye subscore is 4. GCS verbal subscore is 5. GCS motor subscore is 6.     Cranial Nerves: Cranial nerves 2-12 are intact. No cranial nerve deficit, dysarthria or facial asymmetry.     Sensory: Sensation is intact.     Motor: Motor function is intact. No weakness, tremor, atrophy, abnormal muscle tone or seizure activity.     Coordination: Coordination is intact. Coordination normal.     Gait: Gait is intact. Gait normal.     Comments: In/out of chair without difficulty; gait sure and steady in clinic; bilateral hand grasp equal 5/5  Psychiatric:        Attention and Perception: Attention and perception normal.        Mood and Affect: Mood and affect normal.        Speech: Speech normal.        Behavior: Behavior normal. Behavior is  cooperative.        Thought Content: Thought content normal.        Cognition and Memory: Cognition and memory normal.        Judgment: Judgment normal.           Assessment & Plan:   A-morbid obesity and elevated blood pressure without diagnosis of hypertension  P-BMI 40 has lost 1 pt since [facility] Well Appt May 2024 down 10 lbs with diet and exercise. Congratulated patient BP holding steady had water/yogurt/fruit this am for breakfast denied  caffeine.  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  Discussed activity affects blood pressure along with salt intake and caffeine.  Denied illness, feeling well and no pain.  Consider dash diet, continue weight loss efforts and 150 minutes activity per week.  Discussed strategies for holidays portion sizes, moderation on repeat servings high calorie/sweets, increasing activity on days she is holiday eating.  Follow up in 1 month repeat BP/weight.  Patient agreed with plan of care and had no further questions at this time.

## 2023-09-13 ENCOUNTER — Emergency Department
Admission: EM | Admit: 2023-09-13 | Discharge: 2023-09-13 | Disposition: A | Discharge status: Home / Self Care | Attending: Emergency Medicine | Admitting: Emergency Medicine

## 2023-09-13 ENCOUNTER — Encounter: Payer: Self-pay | Admitting: Emergency Medicine

## 2023-09-13 ENCOUNTER — Other Ambulatory Visit: Payer: Self-pay

## 2023-09-13 ENCOUNTER — Emergency Department

## 2023-09-13 ENCOUNTER — Ambulatory Visit
Admission: EM | Admit: 2023-09-13 | Discharge: 2023-09-13 | Disposition: A | Discharge status: Home / Self Care | Attending: Emergency Medicine | Admitting: Emergency Medicine

## 2023-09-13 ENCOUNTER — Ambulatory Visit (INDEPENDENT_AMBULATORY_CARE_PROVIDER_SITE_OTHER)

## 2023-09-13 DIAGNOSIS — S82831A Other fracture of upper and lower end of right fibula, initial encounter for closed fracture: Secondary | ICD-10-CM | POA: Diagnosis not present

## 2023-09-13 DIAGNOSIS — S82891A Other fracture of right lower leg, initial encounter for closed fracture: Secondary | ICD-10-CM | POA: Diagnosis not present

## 2023-09-13 DIAGNOSIS — Y92002 Bathroom of unspecified non-institutional (private) residence single-family (private) house as the place of occurrence of the external cause: Secondary | ICD-10-CM | POA: Insufficient documentation

## 2023-09-13 DIAGNOSIS — S99911A Unspecified injury of right ankle, initial encounter: Secondary | ICD-10-CM | POA: Diagnosis present

## 2023-09-13 DIAGNOSIS — W182XXA Fall in (into) shower or empty bathtub, initial encounter: Secondary | ICD-10-CM | POA: Insufficient documentation

## 2023-09-13 DIAGNOSIS — S93401A Sprain of unspecified ligament of right ankle, initial encounter: Secondary | ICD-10-CM

## 2023-09-13 MED ORDER — HYDROCODONE-ACETAMINOPHEN 5-325 MG PO TABS: 1.0000 | ORAL_TABLET | Freq: Four times a day (QID) | ORAL | 0 refills | Status: AC | PRN

## 2023-09-13 MED ORDER — FENTANYL CITRATE PF 50 MCG/ML IJ SOSY
100.0000 ug | PREFILLED_SYRINGE | Freq: Once | INTRAMUSCULAR | Status: AC
  Administered 2023-09-13: 100 ug via INTRAMUSCULAR
  Filled 2023-09-13: qty 2

## 2023-09-13 NOTE — ED Triage Notes (Signed)
 Pt states she dislocated her right ankle about 4 days ago. She states she fell. She tripped over a bathroom rug at her home. She is wearing a a brace. She states she can bare weight but not a lot. She has been using a walker at home.

## 2023-09-13 NOTE — ED Triage Notes (Addendum)
 Pt to ED via POV from UC. Pt reports 4 days ago injured her ankle after falling on a bathroom rug. Pt xray showed fx of distal end of fibula. Pt right lower leg in splint applied by UC. Pt sent for reduction

## 2023-09-13 NOTE — ED Notes (Signed)
 Patient is being discharged from the Urgent Care and sent to the [facility] Emergency Department via private vehicle with friend . Per Peter Brands, NP, patient is in need of higher level of care due to right ankle fracture needing reduction per Dr. Earlis Glimpse. Patient is aware and verbalizes understanding of plan of care.  Vitals:   09/13/23 1157  BP: 118/73  Pulse: (!) 111  Resp: 16  Temp: 98.8 F (37.1 C)  SpO2: 99%

## 2023-09-13 NOTE — ED Provider Notes (Signed)
 [facility]    CSN: [id number] Arrival date & time: 09/13/23  1138      History   Chief Complaint Chief Complaint  Patient presents with   Ankle Pain    right    HPI Alegandra Vickroy is a 63 y.o. female.   63 year old female, Lailee Millman, presents to urgent care for evaluation of right ankle pain that occurred 4 days prior.  Patient states she tripped on the bathroom rug and" dislocated" her right ankle.  Patient states she is able to bear weight barely and has been using a Velcro brace since, also using a walker at home and with assistance from significant other. Taking OTC meds for pain management.   The history is provided by the patient. No language interpreter was used.    Past Medical History:  Diagnosis Date   Allergy    Obesity 2014    Patient Active Problem List   Diagnosis Date Noted   Closed fracture of right distal fibula 09/13/2023   Sprain of right ankle 09/13/2023   Hyperlipidemia LDL goal <100 09/30/2022   Seasonal allergic rhinitis due to pollen 08/28/2019   Atopic dermatitis 10/16/2016    History reviewed. No pertinent surgical history.  OB History   No obstetric history on file.      Home Medications    Prior to Admission medications   Medication Sig Start Date End Date Taking? Authorizing Provider  HYDROcodone-acetaminophen  (NORCO/VICODIN) 5-325 MG tablet Take 1 tablet by mouth every 6 (six) hours as needed for up to 3 days for severe pain (pain score 7-10). 09/13/23 09/16/23 Yes Jonnae Fonseca, Eveleen Hinds, NP  CHIA SEED PO Take 1 Dose by mouth daily with lunch.    [provider]  diclofenac  (VOLTAREN ) 75 MG EC tablet Take 1 tablet (75 mg total) by mouth 2 (two) times daily. Patient not taking: Reported on 12/04/2022 10/24/21   Richardine Chancy, NP  fluocinonide -emollient (LIDEX -E) 0.05 % cream Apply 1 application topically 2 (two) times daily. 04/18/20   Betancourt, Cleotis Daily, NP  fluticasone  (FLONASE ) 50 MCG/ACT nasal spray Place 1  spray into both nostrils 2 (two) times daily. 05/17/22 05/17/23  Betancourt, Cleotis Daily, NP  Garlic (GARLIQUE) 400 MG TBEC Take 1 tablet by mouth daily after lunch.    [provider]  Multiple Vitamins-Minerals (WOMENS MULTIVITAMIN PLUS PO) Take by mouth.    [provider]    Family History History reviewed. No pertinent family history.  Social History Social History   Tobacco Use   Smoking status: Never   Smokeless tobacco: Never  Vaping Use   Vaping status: Never Used  Substance Use Topics   Alcohol use: Not Currently   Drug use: Not Currently     Allergies   Codeine and Nickel   Review of Systems Review of Systems  Constitutional:  Negative for fever.  Musculoskeletal:  Positive for arthralgias, gait problem and joint swelling.  Skin: Negative.   All other systems reviewed and are negative.    Physical Exam Triage Vital Signs ED Triage Vitals  Encounter Vitals Group     BP      Systolic BP Percentile      Diastolic BP Percentile      Pulse      Resp      Temp      Temp src      SpO2      Weight      Height      Head Circumference  Peak Flow      Pain Score      Pain Loc      Pain Education      Exclude from Growth Chart    No data found.  Updated Vital Signs BP 118/73 (BP Location: Left Arm)   Pulse (!) 111   Temp 98.8 F (37.1 C) (Oral)   Resp 16   Ht 5\' 8"  (1.727 m)   Wt 266 lb 1.5 oz (120.7 kg)   SpO2 99%   BMI 40.46 kg/m   Visual Acuity Right Eye Distance:   Left Eye Distance:   Bilateral Distance:    Right Eye Near:   Left Eye Near:    Bilateral Near:     Physical Exam Vitals and nursing note reviewed.  Cardiovascular:     Rate and Rhythm: Normal rate.     Pulses:          Dorsalis pedis pulses are 1+ on the right side.  Pulmonary:     Effort: Pulmonary effort is normal.  Musculoskeletal:     Right ankle: Swelling and deformity present. No ecchymosis. Tenderness present over the lateral malleolus and  medial malleolus. Decreased range of motion. Normal pulse.       Legs:     Comments: Good distal movement and sensation of toes  Feet:     Right foot:     Skin integrity: Skin integrity normal.     Toenail Condition: Right toenails are abnormally thick and long. Fungal disease present. Neurological:     General: No focal deficit present.     Mental Status: She is alert and oriented to person, place, and time.     GCS: GCS eye subscore is 4. GCS verbal subscore is 5. GCS motor subscore is 6.     Cranial Nerves: No cranial nerve deficit.     Sensory: No sensory deficit.  Psychiatric:        Attention and Perception: Attention normal.        Mood and Affect: Mood normal.        Speech: Speech normal.        Behavior: Behavior normal.      UC Treatments / Results  Labs (all labs ordered are listed, but only abnormal results are displayed) Labs Reviewed - No data to display  EKG   Radiology DG Ankle Complete Right Result Date: 09/13/2023 CLINICAL DATA:  Pain after fall EXAM: RIGHT ANKLE - COMPLETE 3 VIEW COMPARISON:  None Available. FINDINGS: Oblique displaced distal fibular metaphyseal fracture identified. There is also asymmetry of the ankle mortise with widening of the joint space medially. Please correlate for soft tissue injury. Global soft tissue swelling. Preserved bone mineralization. Mild degenerative changes of the dorsal aspect of the midfoot on lateral view. Calcaneal well-corticated spurs. IMPRESSION: Distal fibular oblique displaced fracture with widening of the ankle mortise consistent with subluxation and potential soft tissue injury. Soft tissue swelling. Degenerative changes. Electronically Signed   By: Adrianna Horde M.D.   On: 09/13/2023 13:04    Procedures Procedures (including critical care time)  Medications Ordered in UC Medications - No data to display  Initial Impression / Assessment and Plan / UC Course  I have reviewed the triage vital signs and the  nursing notes.  Pertinent labs & imaging results that were available during my care of the patient were reviewed by me and considered in my medical decision making (see chart for details).  Clinical Course as of 09/13/23 1352  Fri Sep 13, 2023  1219 Right ankle xray ordered d/t right ankle injury 4 days prior [JD]  1230 Right distal fibula fracture with mortise disruption, wet read by this provider 12.45 mm mortise offset noted, ice,elevation of right extremity [JD]  1235 Paged Dr. Daun Epstein, Ortho on call, regarding management of fracture. [JD]  1302 Paged Dr. Daun Epstein, regarding Ortho on call, management of fracture [JD]  1324 Per Dr. Daun Epstein, reviewed imaging and recommends: pt needs to be splinted (OCL posterior/stirrup applied), pt tolerated well, NV intact pre/post splint application) and go to ER for reduction of right ankle.  [JD]  1330 Spoke with triage RN, pt coming POV for ankle reduction.  [JD]    Clinical Course User Index [JD] Zarion Oliff, Eveleen Hinds, NP    Ddx: Distal right fibula fracture, Right ankle subluxation, ankle sprain Final Clinical Impressions(s) / UC Diagnoses   Final diagnoses:  Closed fracture of distal end of right fibula, unspecified fracture morphology, initial encounter  Sprain of right ankle, unspecified ligament, initial encounter     Discharge Instructions      You have a fracture of right ankle- Go to ER for reduction of right ankle Wear splint until seen by Ortho Rest,ice,elevate right leg Use your walker, no weight bearing on right ankle Take vicodin as prescribed, will make you sleepy    ED Prescriptions     Medication Sig Dispense Auth. Provider   HYDROcodone-acetaminophen  (NORCO/VICODIN) 5-325 MG tablet Take 1 tablet by mouth every 6 (six) hours as needed for up to 3 days for severe pain (pain score 7-10). 10 tablet Franca Stakes, Eveleen Hinds, NP      I have reviewed the PDMP during this encounter.   Peter Brands, NP 09/13/23 1352

## 2023-09-13 NOTE — ED Provider Notes (Signed)
 [facility] Provider Note    Event Date/Time   First MD Initiated Contact with Patient 09/13/23 1516     (approximate)   History   Ankle Pain   HPI  Kathleen Silva is a 63 y.o. female with no significant past medical history presents emergency department from the urgent care with ankle fracture with mortise disruption.  Was splinted at the urgent care.  Patient states that she had a little something to drink and 1 piece of liver around 1 PM.  Patient states the injury happened 4 days ago.  She fell in the bathroom.  No other injury.  States she did put a brace on it had been using a walker and could not tolerate it anymore.      Physical Exam   Triage Vital Signs: ED Triage Vitals  Encounter Vitals Group     BP 09/13/23 1402 (!) 126/56     Systolic BP Percentile --      Diastolic BP Percentile --      Pulse Rate 09/13/23 1402 (!) 112     Resp 09/13/23 1402 20     Temp 09/13/23 1402 98.6 F (37 C)     Temp Source 09/13/23 1402 Oral     SpO2 09/13/23 1402 98 %     Weight 09/13/23 1403 266 lb 1.5 oz (120.7 kg)     Height 09/13/23 1516 5\' 8"  (1.727 m)     Head Circumference --      Peak Flow --      Pain Score 09/13/23 1402 8     Pain Loc --      Pain Education --      Exclude from Growth Chart --     Most recent vital signs: Vitals:   09/13/23 1402  BP: (!) 126/56  Pulse: (!) 112  Resp: 20  Temp: 98.6 F (37 C)  SpO2: 98%     General: Awake, no distress.   CV:  Good peripheral perfusion.  Resp:  Normal effort.  Abd:  No distention.   Other:  Right ankle in splint, tender at the medial and lateral malleolus, neurovascular intact   ED Results / Procedures / Treatments   Labs (all labs ordered are listed, but only abnormal results are displayed) Labs Reviewed - No data to display   EKG     RADIOLOGY X-ray from urgent care reviewed    PROCEDURES:   .Splint Application  Date/Time: 09/13/2023 5:17 PM  Performed by:  Coralyn Derry, PA-C Authorized by: Delsie Figures, PA-C   Consent:    Consent obtained:  Verbal   Consent given by:  Patient   Risks, benefits, and alternatives were discussed: yes     Risks discussed:  Discoloration, numbness, pain and swelling   Alternatives discussed:  Delayed treatment Universal protocol:    Procedure explained and questions answered to patient or proxy's satisfaction: yes     Immediately prior to procedure a time out was called: yes     Patient identity confirmed:  Verbally with patient Pre-procedure details:    Distal neurologic exam:  Normal   Distal perfusion: distal pulses strong   Procedure details:    Location:  Ankle   Ankle location:  R ankle   Splint type:  Short leg and ankle stirrup   Supplies:  Cotton padding, fiberglass and elastic bandage   Attestation: Splint applied and adjusted personally by me   Post-procedure details:    Distal neurologic exam:  Normal   Distal perfusion: distal pulses strong     Procedure completion:  Tolerated well, no immediate complications   Post-procedure imaging: reviewed     Critical Care:   Chief Complaint  Patient presents with   Ankle Pain      MEDICATIONS ORDERED IN ED: Medications  fentaNYL (SUBLIMAZE) injection 100 mcg (100 mcg Intramuscular Given 09/13/23 1557)     IMPRESSION / MDM / ASSESSMENT AND PLAN / ED COURSE  I reviewed the triage vital signs and the nursing notes.                              Differential diagnosis includes, but is not limited to, fracture, dislocation, ligament injury  Patient's presentation is most consistent with acute illness / injury with system symptoms.   I did independently review the x-ray from urgent care of the right ankle, shows a large displacement of the mortise, fibula fracture, most likely indicating ligament injury  Consult orthopedics spoke with Dr. Artie Bimler.  States emergent surgery not necessary today.  Try to reduce the ankle, splint,  nonweightbearing and follow-up with orthopedics.   Reduction performed.  Mortise is still wide.  Did call Dr. Artie Bimler once again to discuss to make sure he is okay with her being discharged at this time.  States as long as the talus is in place he is okay with her being discharged.  He did look at the postreduction x-rays and once again states follow-up outpatient for surgery.  I did explain everything to the patient.  She is to elevate and ice.  Be nonweightbearing.  Gave her a work note to be out of work until released by Haematologist.  Did offer pain medication.  She states she just wants to take Tylenol .  I encouraged her to avoid NSAIDs in case she is able to go to surgery next week.  She is to call [facility] clinic orthopedics on Monday morning.  Schedule an appointment.  Return emergency department if worsening.  She is in agreement treatment plan.  Discharged stable condition.   FINAL CLINICAL IMPRESSION(S) / ED DIAGNOSES   Final diagnoses:  Closed fracture of right ankle, initial encounter     Rx / DC Orders   ED Discharge Orders     None        Note:  This document was prepared using Dragon voice recognition software and may include unintentional dictation errors.    Delsie Figures, PA-C 09/13/23 Anders Katz, MD 09/13/23 [phone]

## 2023-09-13 NOTE — Discharge Instructions (Addendum)
 You have a fracture of right ankle- Go to ER for reduction of right ankle Wear splint until seen by Ortho Rest,ice,elevate right leg Use your walker, no weight bearing on right ankle Take vicodin as prescribed, will make you sleepy

## 2023-09-13 NOTE — ED Notes (Signed)
 See triage note  Presents with to right ankle  States she fell 4 days ago  Was sent in from UC with fx to ankle  Splint in place

## 2023-09-13 NOTE — Discharge Instructions (Signed)
 Call orthopedics on Monday morning for an appointment.  Tell them we talked to the on-call doctor over the weekend.  You will need surgery on your ankle as the mortise is disrupted. Take Tylenol  for pain.  Elevate and ice.  Do not bear weight on the right foot. Return emergency department if you have worsening pain, or you cannot feel your toes

## 2023-09-18 ENCOUNTER — Ambulatory Visit: Payer: Self-pay | Admitting: Registered Nurse

## 2023-09-23 ENCOUNTER — Other Ambulatory Visit: Payer: Self-pay | Admitting: Surgery

## 2023-09-24 ENCOUNTER — Ambulatory Visit
Admission: RE | Admit: 2023-09-24 | Discharge: 2023-09-24 | Disposition: A | Discharge status: Home / Self Care | Attending: Surgery | Admitting: Surgery

## 2023-09-24 ENCOUNTER — Other Ambulatory Visit: Payer: Self-pay

## 2023-09-24 ENCOUNTER — Ambulatory Visit

## 2023-09-24 ENCOUNTER — Encounter
Admission: RE | Disposition: A | Discharge status: Home / Self Care | Payer: Self-pay | Source: Home / Self Care | Attending: Surgery

## 2023-09-24 ENCOUNTER — Encounter: Payer: Self-pay | Admitting: Surgery

## 2023-09-24 ENCOUNTER — Ambulatory Visit: Admitting: Registered Nurse

## 2023-09-24 DIAGNOSIS — S82831A Other fracture of upper and lower end of right fibula, initial encounter for closed fracture: Secondary | ICD-10-CM | POA: Insufficient documentation

## 2023-09-24 DIAGNOSIS — E66813 Obesity, class 3: Secondary | ICD-10-CM | POA: Diagnosis not present

## 2023-09-24 DIAGNOSIS — Z01812 Encounter for preprocedural laboratory examination: Secondary | ICD-10-CM

## 2023-09-24 DIAGNOSIS — W228XXA Striking against or struck by other objects, initial encounter: Secondary | ICD-10-CM | POA: Insufficient documentation

## 2023-09-24 HISTORY — PX: ORIF ANKLE FRACTURE: SHX5408

## 2023-09-24 SURGERY — OPEN REDUCTION INTERNAL FIXATION (ORIF) ANKLE FRACTURE
Anesthesia: General | Site: Ankle | Laterality: Right

## 2023-09-24 MED ORDER — ACETAMINOPHEN 325 MG PO TABS: 325.0000 mg | ORAL_TABLET | Freq: Four times a day (QID) | ORAL | Status: DC | PRN

## 2023-09-24 MED ORDER — LIDOCAINE HCL (CARDIAC) PF 100 MG/5ML IV SOSY
PREFILLED_SYRINGE | INTRAVENOUS | Status: DC | PRN
  Administered 2023-09-24: 80 mg via INTRAVENOUS

## 2023-09-24 MED ORDER — BUPIVACAINE LIPOSOME 1.3 % IJ SUSP
INTRAMUSCULAR | Status: DC | PRN
  Administered 2023-09-24: 10 mL

## 2023-09-24 MED ORDER — LACTATED RINGERS IV SOLN: INTRAVENOUS | Status: DC

## 2023-09-24 MED ORDER — CHLORHEXIDINE GLUCONATE 0.12 % MT SOLN
15.0000 mL | Freq: Once | OROMUCOSAL | Status: AC
  Administered 2023-09-24: 15 mL via OROMUCOSAL

## 2023-09-24 MED ORDER — MIDAZOLAM HCL 2 MG/2ML IJ SOLN
INTRAMUSCULAR | Status: DC | PRN
  Administered 2023-09-24: 2 mg via INTRAVENOUS

## 2023-09-24 MED ORDER — FENTANYL CITRATE (PF) 100 MCG/2ML IJ SOLN
INTRAMUSCULAR | Status: DC | PRN
Start: 2023-09-24 — End: 2023-09-24
  Administered 2023-09-24 (×2): 50 ug via INTRAVENOUS
  Administered 2023-09-24: 100 ug via INTRAVENOUS

## 2023-09-24 MED ORDER — ONDANSETRON HCL 4 MG/2ML IJ SOLN
INTRAMUSCULAR | Status: DC | PRN
  Administered 2023-09-24: 4 mg via INTRAVENOUS

## 2023-09-24 MED ORDER — PROPOFOL 10 MG/ML IV BOLUS
INTRAVENOUS | Status: DC | PRN
  Administered 2023-09-24: 150 mg via INTRAVENOUS

## 2023-09-24 MED ORDER — MIDAZOLAM HCL 2 MG/2ML IJ SOLN
INTRAMUSCULAR | Status: AC
  Filled 2023-09-24: qty 2

## 2023-09-24 MED ORDER — KETOROLAC TROMETHAMINE 15 MG/ML IJ SOLN
15.0000 mg | Freq: Once | INTRAMUSCULAR | Status: AC
  Administered 2023-09-24: 15 mg via INTRAVENOUS

## 2023-09-24 MED ORDER — BUPIVACAINE LIPOSOME 1.3 % IJ SUSP
INTRAMUSCULAR | Status: AC
  Filled 2023-09-24: qty 10

## 2023-09-24 MED ORDER — CHLORHEXIDINE GLUCONATE 0.12 % MT SOLN
OROMUCOSAL | Status: AC
  Filled 2023-09-24: qty 15

## 2023-09-24 MED ORDER — BUPIVACAINE HCL (PF) 0.5 % IJ SOLN
INTRAMUSCULAR | Status: DC | PRN
Start: 2023-09-24 — End: 2023-09-24
  Administered 2023-09-24: 10 mL

## 2023-09-24 MED ORDER — HYDROCODONE-ACETAMINOPHEN 5-325 MG PO TABS
ORAL_TABLET | ORAL | Status: AC
  Filled 2023-09-24: qty 1

## 2023-09-24 MED ORDER — KETOROLAC TROMETHAMINE 15 MG/ML IJ SOLN
INTRAMUSCULAR | Status: AC
  Filled 2023-09-24: qty 1

## 2023-09-24 MED ORDER — DEXAMETHASONE SODIUM PHOSPHATE 10 MG/ML IJ SOLN
INTRAMUSCULAR | Status: AC
  Filled 2023-09-24: qty 1

## 2023-09-24 MED ORDER — FENTANYL CITRATE (PF) 100 MCG/2ML IJ SOLN
INTRAMUSCULAR | Status: AC
Start: 2023-09-24 — End: ?
  Filled 2023-09-24: qty 2

## 2023-09-24 MED ORDER — BUPIVACAINE HCL (PF) 0.5 % IJ SOLN
INTRAMUSCULAR | Status: AC
  Filled 2023-09-24: qty 30

## 2023-09-24 MED ORDER — BUPIVACAINE HCL (PF) 0.5 % IJ SOLN
INTRAMUSCULAR | Status: AC
  Filled 2023-09-24: qty 10

## 2023-09-24 MED ORDER — ONDANSETRON HCL 4 MG/2ML IJ SOLN
INTRAMUSCULAR | Status: AC
  Filled 2023-09-24: qty 2

## 2023-09-24 MED ORDER — METOCLOPRAMIDE HCL 10 MG PO TABS: 5.0000 mg | ORAL_TABLET | Freq: Three times a day (TID) | ORAL | Status: DC | PRN

## 2023-09-24 MED ORDER — METOCLOPRAMIDE HCL 5 MG/ML IJ SOLN: 5.0000 mg | Freq: Three times a day (TID) | INTRAMUSCULAR | Status: DC | PRN

## 2023-09-24 MED ORDER — ONDANSETRON HCL 4 MG/2ML IJ SOLN: 4.0000 mg | Freq: Four times a day (QID) | INTRAMUSCULAR | Status: DC | PRN

## 2023-09-24 MED ORDER — ACETAMINOPHEN 10 MG/ML IV SOLN
INTRAVENOUS | Status: AC
  Filled 2023-09-24: qty 100

## 2023-09-24 MED ORDER — ONDANSETRON HCL 4 MG PO TABS: 4.0000 mg | ORAL_TABLET | Freq: Four times a day (QID) | ORAL | Status: DC | PRN

## 2023-09-24 MED ORDER — ACETAMINOPHEN 10 MG/ML IV SOLN
INTRAVENOUS | Status: DC | PRN
  Administered 2023-09-24: 1000 mg via INTRAVENOUS

## 2023-09-24 MED ORDER — PROPOFOL 10 MG/ML IV BOLUS
INTRAVENOUS | Status: AC
  Filled 2023-09-24: qty 20

## 2023-09-24 MED ORDER — HYDROCODONE-ACETAMINOPHEN 5-325 MG PO TABS
1.0000 | ORAL_TABLET | ORAL | Status: DC | PRN
  Administered 2023-09-24: 1 via ORAL

## 2023-09-24 MED ORDER — MIDAZOLAM HCL 2 MG/2ML IJ SOLN
1.0000 mg | INTRAMUSCULAR | Status: DC | PRN
  Administered 2023-09-24: 1 mg via INTRAVENOUS

## 2023-09-24 MED ORDER — DEXAMETHASONE SODIUM PHOSPHATE 10 MG/ML IJ SOLN
INTRAMUSCULAR | Status: DC | PRN
  Administered 2023-09-24: 8 mg via INTRAVENOUS

## 2023-09-24 MED ORDER — SODIUM CHLORIDE 0.9 % IV SOLN
INTRAVENOUS | Status: DC
Start: 2023-09-24 — End: 2023-09-24

## 2023-09-24 MED ORDER — 0.9 % SODIUM CHLORIDE (POUR BTL) OPTIME
TOPICAL | Status: DC | PRN
  Administered 2023-09-24: 500 mL

## 2023-09-24 MED ORDER — CEFAZOLIN SODIUM-DEXTROSE 2-4 GM/100ML-% IV SOLN
2.0000 g | INTRAVENOUS | Status: AC
  Administered 2023-09-24: 3 g via INTRAVENOUS

## 2023-09-24 MED ORDER — ORAL CARE MOUTH RINSE: 15.0000 mL | Freq: Once | OROMUCOSAL | Status: AC

## 2023-09-24 MED ORDER — CEFAZOLIN SODIUM-DEXTROSE 2-4 GM/100ML-% IV SOLN
INTRAVENOUS | Status: AC
  Filled 2023-09-24: qty 100

## 2023-09-24 MED ORDER — DEXMEDETOMIDINE HCL IN NACL 80 MCG/20ML IV SOLN
INTRAVENOUS | Status: DC | PRN
  Administered 2023-09-24: 8 ug via INTRAVENOUS
  Administered 2023-09-24: 12 ug via INTRAVENOUS
  Administered 2023-09-24: 16 ug via INTRAVENOUS

## 2023-09-24 SURGICAL SUPPLY — 50 items
BIT DRILL 2.0 LONG 140 (BIT) IMPLANT
BIT DRILL 2.5X2.75 QC CALB (BIT) IMPLANT
BIT DRILL 3.5X5.5 QC CALB (BIT) IMPLANT
BIT DRILL SOLID 2.0 X 110MM (DRILL) IMPLANT
BLADE SURG SZ10 CARB STEEL (BLADE) ×2 IMPLANT
BNDG COHESIVE 4X5 TAN STRL LF (GAUZE/BANDAGES/DRESSINGS) ×1 IMPLANT
BNDG ELASTIC 4INX 5YD STR LF (GAUZE/BANDAGES/DRESSINGS) ×2 IMPLANT
BNDG ELASTIC 6INX 5YD STR LF (GAUZE/BANDAGES/DRESSINGS) ×1 IMPLANT
BNDG ESMARCH 6X12 STRL LF (GAUZE/BANDAGES/DRESSINGS) ×1 IMPLANT
CHLORAPREP W/TINT 26 (MISCELLANEOUS) ×2 IMPLANT
CUFF TRNQT CYL 24X4X16.5-23 (TOURNIQUET CUFF) IMPLANT
CUFF TRNQT CYL 34X4.125X (TOURNIQUET CUFF) IMPLANT
DRAPE C-ARM XRAY 36X54 (DRAPES) ×1 IMPLANT
DRAPE C-ARMOR (DRAPES) ×1 IMPLANT
DRAPE INCISE IOBAN 66X45 STRL (DRAPES) ×1 IMPLANT
DRAPE SURG ORHT 6 SPLT 77X108 (DRAPES) ×1 IMPLANT
DRAPE U-SHAPE 47X51 STRL (DRAPES) ×1 IMPLANT
ELECT CAUTERY BLADE 6.4 (BLADE) ×1 IMPLANT
ELECTRODE REM PT RTRN 9FT ADLT (ELECTROSURGICAL) ×1 IMPLANT
GAUZE SPONGE 4X4 12PLY STRL (GAUZE/BANDAGES/DRESSINGS) ×1 IMPLANT
GAUZE XEROFORM 1X8 LF (GAUZE/BANDAGES/DRESSINGS) ×1 IMPLANT
GLOVE BIO SURGEON STRL SZ8 (GLOVE) ×2 IMPLANT
GLOVE INDICATOR 8.0 STRL GRN (GLOVE) ×1 IMPLANT
GOWN STRL REUS W/ TWL LRG LVL3 (GOWN DISPOSABLE) ×1 IMPLANT
GOWN STRL REUS W/ TWL XL LVL3 (GOWN DISPOSABLE) ×1 IMPLANT
KIT TURNOVER KIT A (KITS) ×1 IMPLANT
LABEL OR SOLS (LABEL) ×1 IMPLANT
MANIFOLD NEPTUNE II (INSTRUMENTS) ×1 IMPLANT
NS IRRIG 1000ML POUR BTL (IV SOLUTION) ×1 IMPLANT
PACK EXTREMITY ARMC (MISCELLANEOUS) ×1 IMPLANT
PAD ABD DERMACEA PRESS 5X9 (GAUZE/BANDAGES/DRESSINGS) ×2 IMPLANT
PAD CAST 4YDX4 CTTN HI CHSV (CAST SUPPLIES) ×2 IMPLANT
PAD PREP OB/GYN DISP 24X41 (PERSONAL CARE ITEMS) ×1 IMPLANT
PENCIL SMOKE EVACUATOR (MISCELLANEOUS) ×1 IMPLANT
PLATE LAT MALL 6H RT (Plate) IMPLANT
SCREW 3.5X16 NONLOCKING (Screw) IMPLANT
SCREW CORTICAL 3.5MM 26MM (Screw) IMPLANT
SCREW LOCK PLATE R3 2.7X14 (Screw) IMPLANT
SCREW LOCK PLATE R3 2.7X17 (Screw) IMPLANT
SCREW LOCK PLATE R3 2.7X20 (Screw) IMPLANT
SCREW NON LOCKING 3.5X14 (Screw) IMPLANT
SPLINT CAST 1 STEP 4X30 (MISCELLANEOUS) ×2 IMPLANT
SPONGE T-LAP 18X18 ~~LOC~~+RFID (SPONGE) ×1 IMPLANT
STAPLER SKIN PROX 35W (STAPLE) ×1 IMPLANT
STOCKINETTE IMPERV 14X48 (MISCELLANEOUS) ×1 IMPLANT
SUT VIC AB 0 CT1 36 (SUTURE) ×1 IMPLANT
SUT VIC AB 2-0 SH 27XBRD (SUTURE) ×2 IMPLANT
SYR 10ML LL (SYRINGE) ×1 IMPLANT
TRAP FLUID SMOKE EVACUATOR (MISCELLANEOUS) ×1 IMPLANT
WATER STERILE IRR 500ML POUR (IV SOLUTION) ×1 IMPLANT

## 2023-09-24 NOTE — H&P (Signed)
 Subjective:  Chief complaint: Right ankle pain.  The patient is a 63 y.o. female who sustained an injury to the right ankle 2 weeks ago.  Apparently she tripped on the bathroom rug, injuring her ankle.  She tried to apply a Velcro brace and use a walker but because of continued pain, finally presented to the local urgent care clinic 4 days later where x-rays demonstrated a displaced right distal fibular fracture with mortise disruption.  The patient was sent to the local emergency room where the ankle was reduced and placed into a splint.  The patient presents at this time for definitive management of her injury.  The patient denies any associated injury. The patient did not strike his head or lose consciousness. The patient also denies any light-headedness, dizziness, chest pain, or shortness of breath which might have contributed to the injury.  Patient Active Problem List   Diagnosis Date Noted   Closed fracture of right distal fibula 09/13/2023   Sprain of right ankle 09/13/2023   Hyperlipidemia LDL goal <100 09/30/2022   Seasonal allergic rhinitis due to pollen 08/28/2019   Atopic dermatitis 10/16/2016   Past Medical History:  Diagnosis Date   Allergy    Obesity 2014    No past surgical history on file.  No medications prior to admission.   Allergies  Allergen Reactions   Codeine    Nickel Rash    Social History   Tobacco Use   Smoking status: Never   Smokeless tobacco: Never  Substance Use Topics   Alcohol use: Not Currently    No family history on file.   Review of Systems: As noted above. The patient denies any chest pain, shortness of breath, nausea, vomiting, diarrhea, constipation, belly pain, blood in his/her stool, or burning with urination.  Physical Exam: General:  Alert, no acute distress Psychiatric:  Patient is competent for consent with normal mood and affect Cardiovascular:  RRR  Respiratory:  Clear to auscultation. No wheezing. Non-labored  breathing GI:  Abdomen is soft and non-tender Skin:  No lesions in the area of chief complaint Neurologic:  Sensation intact distally Lymphatic:  No axillary or cervical lymphadenopathy  Orthopedic Exam:  Orthopedic examination is limited of the right lower extremity and foot.  The lower leg and foot is in a posterior splint with sugar-tong supplement.  The splint appears to be in reasonably good condition, although there is some evidence of scuffing/wear on the bottom of the splint, suggesting that she has been putting her foot down.  The skin is intact at the proximal and distal margins of the splint.  She is able to dorsiflex and plantarflex her toes.  She has good capillary refill to her foot.  Imaging Review: Recent x-rays of the right ankle are available for review and have been reviewed by myself.  These films demonstrate a mildly displaced distal fibular fracture with mild lateral subluxation of the talus beneath the tibia, resulting in mild medial clear space widening.  No significant degenerative changes of the ankle joint are noted at this time.  No other acute bony abnormalities are identified.  Assessment: Unstable right distal fibular fracture.  Plan: The treatment options, including both surgical and nonsurgical choices, have been discussed in detail with the patient and her boyfriend.  The patient would like to proceed with surgical intervention to include an open reduction and internal fixation of the displaced right ankle fracture.  The risks (including bleeding, infection, nerve and/or blood vessel injury, persistent or recurrent  pain, loosening or failure of the components, malunion and/or nonunion, need for further surgery, blood clots, strokes, heart attacks or arrhythmias, pneumonia, etc.) and benefits of the surgical procedure were discussed.  The patient states her understanding and agrees to proceed.  A formal written consent will be obtained by the nursing staff.

## 2023-09-24 NOTE — Op Note (Signed)
 09/24/2023  4:48 PM  Patient:   Kathleen Silva  Pre-Op Diagnosis:   Unstable distal fibular fracture, right ankle.  Post-Op Diagnosis:   Same.  Procedure:   Open reduction and internal fixation of distal fibular fracture, right ankle.  Surgeon:   Lonnie Roberts, MD  Assistant:   None  Anesthesia:   General LMA with popliteal nerve block using Exparel placed preoperative by the anesthesiologist  Findings:   As above.  Complications:   None  EBL:   20 cc  Fluids:   500 cc crystalloid  UOP:   None  TT:   60 min at 250 mmHg  Drains:   None  Closure:   Staples  Implants:   Paragon 6-hole locking distal fibular hook plate and screws  Brief Clinical Note:   The patient is a 63 year old female who sustained above-noted injury 2 weeks ago when she tripped on her bathroom rug.  She presented to the urgent care clinic 4 days later then was referred to the emergency room where her ankle was reduced and splinted.  She was referred to orthopedics and presents at this time for definitive management of her injury.  Procedure:   The patient underwent placement of a popliteal block using Exparel by the anesthesiologist in the preoperative holding area before she was brought into the operating room and laid in the supine position. After adequate general laryngeal mask anesthesia was obtained, the right foot and lower leg were prepped with ChloraPrep solution, then draped sterilely. Preoperative antibiotics were administered. A timeout was performed to verify the appropriate surgical site before the limb was exsanguinated with an Esmarch and the calf tourniquet inflated to 250 mmHg.   Laterally, an 10-12 cm incision was made over the lateral aspect of the distal fibula. The incision was carried down through the subcutaneous tissues to expose the fracture site. The fracture hematoma was debrided before the fracture was reduced and temporarily secured using a bone clamp. A lag screw was placed in  an anterior to posterior direction perpendicular to the fracture.   A 6-hole Paragon distal fibular locking hook plate was applied over the lateral aspect of the distal fibula then impacted into place. After verifying its position fluoroscopically, it was secured using a 2.7 mm nonlocking cortical screw proximal to the fracture in the oblong hole to apply compression across the fracture. Two additional 2.7 mm bicortical nonlocking screws were placed proximal to the fracture before three locking screws were placed into the distal fibula distal to the fracture. The adequacy of fracture reduction and hardware position was verified fluoroscopically in AP and lateral projections and found to be excellent.  The wound was copiously irrigated with sterile saline solution. The subcutaneous tissues were closed in two layers using 2-0 and 3-0 Vicryl interrupted sutures before the skin was closed using staples. Sterile bulky dressings were applied to the wound before the patient was placed into a posterior splint with a sugar tong supplement, maintaining the ankle in neutral dorsiflexion. The patient was then awakened, extubated, and returned to the recovery room in satisfactory condition after tolerating the procedure well.

## 2023-09-24 NOTE — Transfer of Care (Signed)
 Immediate Anesthesia Transfer of Care Note  Patient: Kathleen Silva  Procedure(s) Performed: OPEN REDUCTION INTERNAL FIXATION (ORIF) ANKLE FRACTURE (Right: Ankle)  Patient Location: PACU  Anesthesia Type:General  Level of Consciousness: drowsy  Airway & Oxygen Therapy: Patient Spontanous Breathing and Patient connected to face mask oxygen  Post-op Assessment: Report given to RN  Post vital signs: stable  Last Vitals:  Vitals Value Taken Time  BP 134/73 09/24/23 1638  Temp    Pulse 89 09/24/23 1641  Resp 30 09/24/23 1641  SpO2 100 % 09/24/23 1641  Vitals shown include unfiled device data.  Last Pain:  Vitals:   09/24/23 1323  TempSrc: Temporal  PainSc: 3          Complications: No notable events documented.

## 2023-09-24 NOTE — Anesthesia Postprocedure Evaluation (Signed)
 Anesthesia Post Note  Patient: Jamoni Ibach  Procedure(s) Performed: OPEN REDUCTION INTERNAL FIXATION (ORIF) ANKLE FRACTURE (Right: Ankle)  Patient location during evaluation: PACU Anesthesia Type: General Level of consciousness: awake and alert Pain management: pain level controlled Vital Signs Assessment: post-procedure vital signs reviewed and stable Respiratory status: spontaneous breathing, nonlabored ventilation, respiratory function stable and patient connected to nasal cannula oxygen Cardiovascular status: blood pressure returned to baseline and stable Postop Assessment: no apparent nausea or vomiting Anesthetic complications: no   No notable events documented.   Last Vitals:  Vitals:   09/24/23 1714 09/24/23 1719  BP: 135/69 133/78  Pulse: 85 100  Resp: (!) 23 16  Temp: 36.9 C 36.7 C  SpO2: 100% 100%    Last Pain:  Vitals:   09/24/23 1736  TempSrc:   PainSc: 5                  Portia Brittle Karel Mowers

## 2023-09-24 NOTE — Anesthesia Preprocedure Evaluation (Addendum)
 Anesthesia Evaluation  Patient identified by MRN, date of birth, ID band Patient awake    Reviewed: Allergy & Precautions, NPO status , Patient's Chart, lab work & pertinent test results  History of Anesthesia Complications Negative for: history of anesthetic complications  Airway Mallampati: II  TM Distance: >3 FB Neck ROM: Full    Dental no notable dental hx. (+) Teeth Intact   Pulmonary neg pulmonary ROS, neg sleep apnea, neg COPD, Patient abstained from smoking.Not current smoker   Pulmonary exam normal breath sounds clear to auscultation       Cardiovascular Exercise Tolerance: Good METS(-) hypertension(-) CAD and (-) Past MI negative cardio ROS (-) dysrhythmias  Rhythm:Regular Rate:Normal - Systolic murmurs TTE (done prior to initiating chemotherapy for breast cancer): Summary  1. The left ventricle is normal in size with normal wall thickness.  2. The left ventricular ejection fraction was quantified (MOD single-plane) at 67 %.  3. Longitudinal strain of the left ventricle is normal, -19.3 %.  4. The right ventricle is normal in size, with normal systolic function.  5. IVC size and inspiratory change suggest normal right atrial pressure. (0-5 mmHg).  6. There is no pericardial effusion.  7. No comparison study available for review.     Neuro/Psych negative neurological ROS  negative psych ROS   GI/Hepatic ,neg GERD  ,,(+)     (-) substance abuse    Endo/Other  neg diabetes  Class 3 obesity  Renal/GU negative Renal ROS     Musculoskeletal   Abdominal  (+) + obese  Peds  Hematology   Anesthesia Other Findings Past Medical History: No date: Allergy 2014: Obesity  Reproductive/Obstetrics                             Anesthesia Physical Anesthesia Plan  ASA: 3  Anesthesia Plan: General   Post-op Pain Management: Ofirmev  IV (intra-op)*, Regional block* and Toradol IV  (intra-op)*   Induction: Intravenous  PONV Risk Score and Plan: 3 and Ondansetron, Dexamethasone and Midazolam  Airway Management Planned: LMA  Additional Equipment: None  Intra-op Plan:   Post-operative Plan: Extubation in OR  Informed Consent: I have reviewed the patients History and Physical, chart, labs and discussed the procedure including the risks, benefits and alternatives for the proposed anesthesia with the patient or authorized representative who has indicated his/her understanding and acceptance.     Dental advisory given  Plan Discussed with: CRNA and Surgeon  Anesthesia Plan Comments: (Discussed risks of anesthesia with patient, including PONV, sore throat, lip/dental/eye damage. Rare risks discussed as well, such as cardiorespiratory and neurological sequelae, and allergic reactions. Discussed the role of CRNA in patient's perioperative care. Patient understands. Patient informed about increased incidence of above perioperative risk due to high BMI. Patient understands.  Discussed r/b/a of popoliteal nerve block, including:  - bleeding, infection, nerve damage - poor or non functioning block. - reactions and toxicity to local anesthetic Patient understands. )       Anesthesia Quick Evaluation

## 2023-09-24 NOTE — Discharge Instructions (Addendum)
 Orthopedic discharge instructions: Keep right leg elevated on several pillows. Keep splint dry and intact.  Apply ice frequently to ankle. Take Voltaren  75 mg BID OR ibuprofen 600-800 mg TID with meals for 7-10 days, then as necessary. Take pain medication as prescribed or ES Tylenol  when needed.  No weightbearing on right foot- use crutches or walker for ambulation. Follow-up in 10-14 days or as scheduled.   Peripheral Nerve Block (Lower Extremity) Discharge Instructions    For your surgery you have received a femoral Nerve Block.  Your Nerve Block is expected to last for about 4 to 12 hours.  This is an estimated time frame; the results of your nerve block may wear off sooner or may last longer.  If needed, your surgeon will give you a prescription for pain medication.  It will take about 60 minutes for the oral pain medication to become fully effective.  So, it is recommended that you start taking this medication before the nerve block first begins to wear off, or when you first begin to feel discomfort.  Keep in mind that nerve blocks often wear off in the middle of the night.   If you are going to bed and the block has not started to wear off or you have not started to have any discomfort, consider setting an alarm for 2 to 3 hours, so you can assess your block.  If you notice the block is wearing off or you are starting to have discomfort, you can take your pain medication.  Take your pain medication only as prescribed.  Pain medication can cause sedation and decrease your breathing if you take more than you need for the level of pain that you have.  Nausea is a common side effect of many pain medications.  You may want to eat something before taking your pain medicine to prevent nausea.  After a Peripheral Nerve block, you cannot feel pain, pressure or extremes in temperature in the effected leg.  Because your leg is numb it is at an increased risk for injury.  To decrease the  possibility of injury, please practice the following:  While you are awake change the position of your leg frequently to prevent too much pressure on any one area for prolonged periods of time.   If you have a cast or tight dressing, check the color or your toes every couple of hours.  Call your surgeon with the appearance of any discoloration (white or blue).  You may have difficulty bearing weight on the effected leg.  Have someone assist you with walking until the nerve block has completely worn off.   If you surgeon prescribed a brace to be worn after surgery, DO NOT GET UP AT NIGHT WITHOUT YOUR BRACE.  If your surgeon has restricted the amount of weight you should bear on the effected leg, i.e. No Weight, Partial Weight, or Touch Down Only, DO NOT BEAR MORE WEIGHT THAN INSTRUCTED.   If you experience any problems or concerns, please contact your surgeon.

## 2023-09-24 NOTE — Anesthesia Procedure Notes (Signed)
 Anesthesia Regional Block: Popliteal block   Pre-Anesthetic Checklist: , timeout performed,  Correct Patient, Correct Site, Correct Laterality,  Correct Procedure, Correct Position, site marked,  Risks and benefits discussed,  Surgical consent,  Pre-op evaluation,  At surgeon's request and post-op pain management  Laterality: Lower and Right  Prep: chloraprep       Needles:  Injection technique: Single-shot  Needle Type: Echogenic Needle     Needle Length: 9cm  Needle Gauge: 21     Additional Needles:   Procedures:,,,, ultrasound used (permanent image in chart),,    Narrative:  Injection made incrementally with aspirations every 5 mL.  Performed by: Personally  Anesthesiologist: Lattie Poli, MD  Additional Notes: Patient's chart reviewed and they were deemed appropriate candidate for procedure, at surgeon's request. Patient educated about risks, benefits, and alternatives of the block including but not limited to: temporary or permanent nerve damage, bleeding, infection, damage to surround tissues, block failure, local anesthetic toxicity. Patient expressed understanding. A formal time-out was conducted consistent with institution rules.  Monitors were applied, and minimal sedation used. The site was prepped with skin prep and allowed to dry, and sterile gloves were used. A high frequency linear ultrasound probe with probe cover was utilized throughout. Popliteal artery pulsatile and visualized in popliteal fossa. Visualization was overall difficult due to body habitus and depth of structures. What was presumed to be the sciatic nerve was identified and local anesthetic injected at its branch point, and echogenic block needle trajectory was monitored throughout. Color mode used to verify abscence of blood vessels in trajectory. Aspiration performed every 5ml. Blood vessels were avoided. All injections were performed without resistance and free of blood and paresthesias. The patient  tolerated the procedure well.  Injectate: 10ml exparel + 10ml 0.5% bupivacaine

## 2023-09-24 NOTE — Anesthesia Procedure Notes (Signed)
 Procedure Name: LMA Insertion Date/Time: 09/24/2023 2:53 PM  Performed by: Curvin Downing, CRNAPre-anesthesia Checklist: Patient identified, Emergency Drugs available, Suction available and Patient being monitored Patient Re-evaluated:Patient Re-evaluated prior to induction Oxygen Delivery Method: Circle system utilized Preoxygenation: Pre-oxygenation with 100% oxygen Induction Type: IV induction Ventilation: Mask ventilation without difficulty LMA: LMA inserted LMA Size: 4.0 Tube type: Oral Number of attempts: 1 Airway Equipment and Method: Oral airway Placement Confirmation: positive ETCO2 and breath sounds checked- equal and bilateral Tube secured with: Tape Dental Injury: Teeth and Oropharynx as per pre-operative assessment

## 2023-09-25 ENCOUNTER — Encounter: Payer: Self-pay | Admitting: Surgery

## 2023-10-29 ENCOUNTER — Encounter: Payer: Self-pay | Admitting: Surgery

## 2023-11-06 ENCOUNTER — Other Ambulatory Visit: Payer: Self-pay | Admitting: Podiatry

## 2023-11-07 ENCOUNTER — Other Ambulatory Visit: Payer: Self-pay | Admitting: Podiatry

## 2023-11-07 ENCOUNTER — Encounter: Payer: Self-pay | Admitting: Podiatry

## 2023-11-07 DIAGNOSIS — S93431S Sprain of tibiofibular ligament of right ankle, sequela: Secondary | ICD-10-CM

## 2023-11-07 DIAGNOSIS — S82891P Other fracture of right lower leg, subsequent encounter for closed fracture with malunion: Secondary | ICD-10-CM

## 2023-11-07 DIAGNOSIS — G8929 Other chronic pain: Secondary | ICD-10-CM

## 2023-11-07 DIAGNOSIS — S93429D Sprain of deltoid ligament of unspecified ankle, subsequent encounter: Secondary | ICD-10-CM

## 2023-11-12 ENCOUNTER — Other Ambulatory Visit: Payer: Self-pay

## 2023-11-12 ENCOUNTER — Encounter
Admission: RE | Admit: 2023-11-12 | Discharge: 2023-11-12 | Disposition: A | Discharge status: Home / Self Care | Source: Ambulatory Visit | Attending: Podiatry | Admitting: Podiatry

## 2023-11-12 ENCOUNTER — Encounter: Payer: Self-pay | Admitting: Urgent Care

## 2023-11-12 HISTORY — DX: Anemia, unspecified: D64.9

## 2023-11-12 HISTORY — DX: Hidradenitis suppurativa: L73.2

## 2023-11-12 HISTORY — DX: Hyperlipidemia, unspecified: E78.5

## 2023-11-12 HISTORY — DX: Malignant neoplasm of unspecified site of unspecified female breast: C50.919

## 2023-11-12 NOTE — Patient Instructions (Addendum)
 Your procedure is scheduled on: Friday 11/15/23 Report to the Registration Desk on the 1st floor of the [facility]. To find out your arrival time, please call [phone] between 1PM - 3PM on: Thursday 11/14/23 If your arrival time is 6:00 am, do not arrive before that time as the [facility] entrance doors do not open until 6:00 am.  REMEMBER: Instructions that are not followed completely may result in serious medical risk, up to and including death; or upon the discretion of your surgeon and anesthesiologist your surgery may need to be rescheduled.  Do not eat food after midnight the night before surgery.  No gum chewing or hard candies.  You may however, drink CLEAR liquids up to 2 hours before you are scheduled to arrive for your surgery. Do not drink anything within 2 hours of your scheduled arrival time.  Clear liquids include: - water  - apple juice without pulp - gatorade (not RED colors) - black coffee or tea (Do NOT add milk or creamers to the coffee or tea) Do NOT drink anything that is not on this list.  In addition, your doctor has ordered for you to drink the provided:  Ensure Pre-Surgery Clear Carbohydrate Drink  Drinking this carbohydrate drink up to two hours before surgery helps to reduce insulin resistance and improve patient outcomes. Please complete drinking 2 hours before scheduled arrival time.  One week prior to surgery: Stop Anti-inflammatories (NSAIDS) such as Advil, Aleve, Ibuprofen, Motrin, Naproxen, Naprosyn and Aspirin based products such as Excedrin, Goody's Powder, BC Powder. You may however, continue to take Tylenol  if needed for pain up until the day of surgery.  Stop All OVER THE COUNTER supplements and vitamins until after surgery.  Continue taking all of your other prescription medications up until the day of surgery.  ON THE DAY OF SURGERY ONLY TAKE THESE MEDICATIONS WITH SIPS OF WATER:  None but you may use your Flonase  if needed  No  Alcohol for 24 hours before or after surgery.  No Smoking including e-cigarettes for 24 hours before surgery.  No chewable tobacco products for at least 6 hours before surgery.  No nicotine patches on the day of surgery.  Do not use any recreational drugs for at least a week (preferably 2 weeks) before your surgery.  Please be advised that the combination of cocaine and anesthesia may have negative outcomes, up to and including death. If you test positive for cocaine, your surgery will be cancelled.  On the morning of surgery brush your teeth with toothpaste and water, you may rinse your mouth with mouthwash if you wish. Do not swallow any toothpaste or mouthwash.  Use CHG Soap or wipes as directed on instruction sheet.  Do not wear jewelry, make-up, hairpins, clips or toenail polish.  For welded (permanent) jewelry: bracelets, anklets, waist bands, etc.  Please have this removed prior to surgery.  If it is not removed, there is a chance that hospital personnel will need to cut it off on the day of surgery.  Do not wear lotions, powders, or perfumes.   Do not shave body hair from the neck down 48 hours before surgery.  Contact lenses, hearing aids and dentures may not be worn into surgery. Bring a case for your glasses  Do not bring valuables to the hospital. [facility] is not responsible for any missing/lost belongings or valuables.   Notify your doctor if there is any change in your medical condition (cold, fever, infection).  Wear comfortable  clothing (specific to your surgery type) to the hospital.  After surgery, you can help prevent lung complications by doing breathing exercises.  Take deep breaths and cough every 1-2 hours. Your doctor may order a device called an Incentive Spirometer to help you take deep breaths.  If you are being discharged the day of surgery, you will not be allowed to drive home. You will need a responsible individual to drive you home and stay with  you for 24 hours after surgery.   If you are taking public transportation, you will need to have a responsible individual with you.  Please call the Pre-admissions Testing Dept. at [phone] if you have any questions about these instructions.  Surgery Visitation Policy:  Patients having surgery or a procedure may have two visitors.  Children under the age of 93 must have an adult with them who is not the patient.   Merchandiser, retail to address health-related social needs:  [email or web address]     Preparing for Surgery with CHLORHEXIDINE  GLUCONATE (CHG) Soap  Chlorhexidine  Gluconate (CHG) Soap  o An antiseptic cleaner that kills germs and bonds with the skin to continue killing germs even after washing  o Used for showering the night before surgery and morning of surgery  Before surgery, you can play an important role by reducing the number of germs on your skin.  CHG (Chlorhexidine  gluconate) soap is an antiseptic cleanser which kills germs and bonds with the skin to continue killing germs even after washing.  Please do not use if you have an allergy to CHG or antibacterial soaps. If your skin becomes reddened/irritated stop using the CHG.  1. Shower the NIGHT BEFORE SURGERY and the MORNING OF SURGERY with CHG soap.  2. If you choose to wash your hair, wash your hair first as usual with your normal shampoo.  3. After shampooing, rinse your hair and body thoroughly to remove the shampoo.  4. Use CHG as you would any other liquid soap. You can apply CHG directly to the skin and wash gently with a scrungie or a clean washcloth.  5. Apply the CHG soap to your body only from the neck down. Do not use on open wounds or open sores. Avoid contact with your eyes, ears, mouth, and genitals (private parts). Wash face and genitals (private parts) with your normal soap.  6. Wash thoroughly, paying special attention to the area where your surgery will be  performed.  7. Thoroughly rinse your body with warm water.  8. Do not shower/wash with your normal soap after using and rinsing off the CHG soap.  9. Pat yourself dry with a clean towel.  10. Wear clean pajamas to bed the night before surgery.  12. Place clean sheets on your bed the night of your first shower and do not sleep with pets.  13. Shower again with the CHG soap on the day of surgery prior to arriving at the hospital.  14. Do not apply any deodorants/lotions/powders.  15. Please wear clean clothes to the hospital.

## 2023-11-12 NOTE — Telephone Encounter (Signed)
 Attempted to speak to Dr. Lennie at [facility]. He is not available today. Spoke to [employer] and nurse. Right ankle revision and hardware removal, possibly fibular osteotomy with ORIF and deltoid ligament repair, possible debridement. I advised that patient not have surgery on 7/11 given planned chemotherapy on 7/10 and risk of infection, bleeding, and poor wound healing. She has an aggressive type of breast cancer and needs to prioritize keeping on schedule with chemotherapy to maximize chance of cure.  Left message to request callback from Dr. Lennie to discuss needed interventions and optimal timing to try to optimize outcomes for her foot and her cancer. Provided my cellphone number for callback. Corean will request that he call me tomorrow 7/9.  At the same time, will request 2nd opinion from [facility] orthopedics.

## 2023-11-13 ENCOUNTER — Other Ambulatory Visit: Payer: Self-pay

## 2023-11-15 ENCOUNTER — Ambulatory Visit: Admission: RE | Admit: 2023-11-15 | Source: Home / Self Care | Admitting: Podiatry

## 2023-11-15 ENCOUNTER — Encounter: Admission: RE | Payer: Self-pay | Source: Home / Self Care

## 2023-11-15 SURGERY — REPAIR, LIGAMENT
Anesthesia: Choice | Site: Ankle | Laterality: Right

## 2023-11-19 ENCOUNTER — Encounter: Payer: Self-pay | Admitting: Registered Nurse

## 2023-11-19 ENCOUNTER — Telehealth: Payer: Self-pay | Admitting: Registered Nurse

## 2023-11-19 DIAGNOSIS — Z0000 Encounter for general adult medical examination without abnormal findings: Secondary | ICD-10-CM

## 2023-11-19 NOTE — Telephone Encounter (Signed)
 Patient is out on convalescent leave due to foot surgery.  Had labs at [facility] fasting glucose x 3 less than 127 this year as alternative for Hgba1c and BP at surgeon office met requirements for insurance discount 121/78 weight 246lbs 30 lb weight loss since last Be Well BMI 36.32.  Patient asked forms be sent electronically to [email or web address] and she will sign and mail back to clinic.

## 2023-11-22 ENCOUNTER — Other Ambulatory Visit

## 2023-12-11 NOTE — Telephone Encounter (Signed)
 Patient sent NP PDF file of signed paperwork printed and placed in [facility] paper chart.  [employer] form completed and given to HR Jen met alternative requirements for insurance discount starting 05 Feb 2024 on 12/10/23  Patient notified paperwork received and requirements met.

## 2024-02-12 ENCOUNTER — Telehealth: Payer: Self-pay | Admitting: Registered Nurse

## 2024-02-12 ENCOUNTER — Encounter: Payer: Self-pay | Admitting: Registered Nurse

## 2024-02-12 DIAGNOSIS — L209 Atopic dermatitis, unspecified: Secondary | ICD-10-CM

## 2024-02-12 MED ORDER — FLUOCINONIDE EMULSIFIED BASE 0.05 % EX CREA: 1.0000 | TOPICAL_CREAM | Freq: Two times a day (BID) | CUTANEOUS | 0 refills | Status: AC

## 2024-03-25 NOTE — Telephone Encounter (Signed)
 Per HR patient continues on medical leave 03/24/24

## 2024-06-12 NOTE — Telephone Encounter (Signed)
 Per HR patient returned to work 06/11/24 half days will follow up with patient 06/16/24 as she had already left work for the day.
